# Patient Record
Sex: Male | Born: 1995 | Race: White | Hispanic: No | Marital: Single | Smoking: Never smoker
Health system: Southern US, Community
[De-identification: ages and names within clinical notes are randomized; demographics above are authoritative.]

## PROBLEM LIST (undated history)

## (undated) DIAGNOSIS — F909 Attention-deficit hyperactivity disorder, unspecified type: Secondary | ICD-10-CM

## (undated) DIAGNOSIS — D6941 Evans syndrome: Secondary | ICD-10-CM

## (undated) DIAGNOSIS — D693 Immune thrombocytopenic purpura: Secondary | ICD-10-CM

## (undated) DIAGNOSIS — D509 Iron deficiency anemia, unspecified: Secondary | ICD-10-CM

## (undated) HISTORY — PX: TONSILLECTOMY: SUR1361

## (undated) HISTORY — DX: Iron deficiency anemia, unspecified: D50.9

## (undated) HISTORY — PX: ADENOIDECTOMY: SUR15

## (undated) HISTORY — PX: TYMPANOSTOMY TUBE PLACEMENT: SHX32

## (undated) HISTORY — DX: Immune thrombocytopenic purpura: D69.3

---
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
## 2017-02-11 ENCOUNTER — Encounter (HOSPITAL_COMMUNITY): Payer: Self-pay

## 2017-04-18 ENCOUNTER — Ambulatory Visit (HOSPITAL_COMMUNITY): Payer: Managed Care, Other (non HMO)

## 2017-04-18 ENCOUNTER — Other Ambulatory Visit (HOSPITAL_COMMUNITY): Payer: Managed Care, Other (non HMO)

## 2017-04-21 ENCOUNTER — Ambulatory Visit (HOSPITAL_COMMUNITY): Payer: Managed Care, Other (non HMO)

## 2017-04-21 ENCOUNTER — Other Ambulatory Visit (HOSPITAL_COMMUNITY): Payer: Managed Care, Other (non HMO)

## 2017-05-05 ENCOUNTER — Other Ambulatory Visit: Payer: Self-pay

## 2017-05-05 ENCOUNTER — Encounter (HOSPITAL_COMMUNITY): Payer: Self-pay | Admitting: Oncology

## 2017-05-05 ENCOUNTER — Inpatient Hospital Stay (HOSPITAL_BASED_OUTPATIENT_CLINIC_OR_DEPARTMENT_OTHER): Payer: Managed Care, Other (non HMO) | Admitting: Oncology

## 2017-05-05 ENCOUNTER — Inpatient Hospital Stay (HOSPITAL_COMMUNITY): Payer: Managed Care, Other (non HMO) | Attending: Oncology

## 2017-05-05 VITALS — BP 136/78 | HR 85 | Temp 98.1°F | Resp 20 | Wt 238.8 lb

## 2017-05-05 DIAGNOSIS — D696 Thrombocytopenia, unspecified: Secondary | ICD-10-CM

## 2017-05-05 DIAGNOSIS — D6941 Evans syndrome: Secondary | ICD-10-CM | POA: Insufficient documentation

## 2017-05-05 DIAGNOSIS — D591 Other autoimmune hemolytic anemias: Secondary | ICD-10-CM | POA: Insufficient documentation

## 2017-05-05 DIAGNOSIS — D509 Iron deficiency anemia, unspecified: Secondary | ICD-10-CM

## 2017-05-05 LAB — CBC WITH DIFFERENTIAL/PLATELET
Basophils Absolute: 0 10*3/uL (ref 0.0–0.1)
Basophils Relative: 0 %
Eosinophils Absolute: 0.1 10*3/uL (ref 0.0–0.7)
Eosinophils Relative: 1 %
HEMATOCRIT: 47.9 % (ref 39.0–52.0)
HEMOGLOBIN: 16.3 g/dL (ref 13.0–17.0)
LYMPHS ABS: 2.2 10*3/uL (ref 0.7–4.0)
Lymphocytes Relative: 31 %
MCH: 28.4 pg (ref 26.0–34.0)
MCHC: 34 g/dL (ref 30.0–36.0)
MCV: 83.4 fL (ref 78.0–100.0)
MONOS PCT: 11 %
Monocytes Absolute: 0.8 10*3/uL (ref 0.1–1.0)
NEUTROS ABS: 4.1 10*3/uL (ref 1.7–7.7)
NEUTROS PCT: 57 %
Platelets: 337 10*3/uL (ref 150–400)
RBC: 5.74 MIL/uL (ref 4.22–5.81)
RDW: 13.7 % (ref 11.5–15.5)
WBC: 7.2 10*3/uL (ref 4.0–10.5)

## 2017-05-05 LAB — RETICULOCYTES
RBC.: 5.67 MIL/uL (ref 4.22–5.81)
Retic Count, Absolute: 51 10*3/uL (ref 19.0–186.0)
Retic Ct Pct: 0.9 % (ref 0.4–3.1)

## 2017-05-05 LAB — COMPREHENSIVE METABOLIC PANEL
ALK PHOS: 90 U/L (ref 38–126)
ALT: 32 U/L (ref 17–63)
ANION GAP: 9 (ref 5–15)
AST: 25 U/L (ref 15–41)
Albumin: 4.5 g/dL (ref 3.5–5.0)
BILIRUBIN TOTAL: 0.9 mg/dL (ref 0.3–1.2)
BUN: 10 mg/dL (ref 6–20)
CO2: 28 mmol/L (ref 22–32)
Calcium: 9.6 mg/dL (ref 8.9–10.3)
Chloride: 102 mmol/L (ref 101–111)
Creatinine, Ser: 0.92 mg/dL (ref 0.61–1.24)
GLUCOSE: 134 mg/dL — AB (ref 65–99)
POTASSIUM: 4.1 mmol/L (ref 3.5–5.1)
Sodium: 139 mmol/L (ref 135–145)
TOTAL PROTEIN: 8.4 g/dL — AB (ref 6.5–8.1)

## 2017-05-05 LAB — IRON AND TIBC
IRON: 121 ug/dL (ref 45–182)
SATURATION RATIOS: 31 % (ref 17.9–39.5)
TIBC: 386 ug/dL (ref 250–450)
UIBC: 265 ug/dL

## 2017-05-05 LAB — LACTATE DEHYDROGENASE: LDH: 137 U/L (ref 98–192)

## 2017-05-05 LAB — FERRITIN: FERRITIN: 97 ng/mL (ref 24–336)

## 2017-05-05 NOTE — Patient Instructions (Signed)
[facility] at [facility] Discharge Instructions  RECOMMENDATIONS MADE BY THE CONSULTANT AND ANY TEST RESULTS WILL BE SENT TO YOUR REFERRING PHYSICIAN.    Thank you for choosing [facility] at [facility] to provide your oncology and hematology care.  To afford each patient quality time with our provider, please arrive at least 15 minutes before your scheduled appointment time.    If you have a lab appointment with the Cancer Center please come in thru the  Main Entrance and check in at the main information desk  You need to re-schedule your appointment should you arrive 10 or more minutes late.  We strive to give you quality time with our providers, and arriving late affects you and other patients whose appointments are after yours.  Also, if you no show three or more times for appointments you may be dismissed from the clinic at the providers discretion.     Again, thank you for choosing [facility].  Our hope is that these requests will decrease the amount of time that you wait before being seen by our physicians.       _____________________________________________________________  Should you have questions after your visit to [facility], please contact our office at [phone] between the hours of 8:30 a.m. and 4:30 p.m.  Voicemails left after 4:30 p.m. will not be returned until the following business day.  For prescription refill requests, have your pharmacy contact our office.       Resources For Cancer Patients and their Caregivers ? American Cancer Society: Can assist with transportation, wigs, general needs, runs Look Good Feel Better.        1-888-227-6333 ? Cancer Care: Provides financial assistance, online support groups, medication/co-pay assistance.  1-800-813-HOPE (4673) ? [facility] Assists [place] cancer patients and their families through emotional , educational  and financial support.  336-427-4357 ? [place] DSS Where to apply for food stamps, Medicaid and utility assistance. 336-342-1394 ? RC[facility]: Transportation to medical appointments. 336-347-2287 ? Social Security Administration: May apply for disability if have a Stage IV cancer. 336-342-7796 1-800-772-1213 ? [place] Aging, Disability and Transit Services: Assists with nutrition, care and transit needs. 336-349-2343  Cancer Center Support Programs: @10RELATIVEDAYS@ > Cancer Support Group  2nd Tuesday of the month 1pm-2pm, Journey Room  > Creative Journey  3rd Tuesday of the month 1130am-1pm, Journey Room  > Look Good Feel Better  1st Wednesday of the month 10am-12 noon, Journey Room (Call American Cancer Society to register 1-800-395-5775)   

## 2017-05-05 NOTE — Progress Notes (Signed)
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  Initially diagnosed on 02/15/2016 when he [facility] with epistaxis and mucosal hematomas with a platelet count of < 10,000.  He responded to IVIG with platelet count reaching > 100,000 within 72 hours of treatment, BUT 11 days after treatment, he again relapsed with a platelet count < 10,000 without evidence of bleeding.  He was re-admitted and given IVIG again with ANOTHER TRANSIENT response.  After an initial drop in HGB from 13 to 8 g/dL during his initial hospitalization, his HGB has been between 9-10 g/dL without transfusion.  DAT positive for IgG (PRIOR to IVIG).  Markers for hemolysis have been consistently negative. Upon transfer of [facility], he began therapy with Rituxan weekly x 4 doses beginning on 03/20/16 and completed on 04/11/16.  AND Iron deficiency anemia.  He received last IV iron infusion on 04/03/16.   INTERVAL HISTORY:  Mr. Reginold AgentGusler 22 y.o. male returns for follow-up for Evans syndrome.   Patient presents today with his mother.  Overall, he tells me he is doing "very well".  Appetite and energy levels are 100%.  Completed Rituxan therapy approximately 1 year ago  (03/2016).  Continues to have nosebleeds on occasion.  They are self-limiting and last approximately 5 minutes without intervention.  He denies any additional bleeding episodes or bruising.  His mother reports that she thinks he is overall doing well.  He is not taking any additional medications at this time.  REVIEW OF SYSTEMS:  Review of Systems  Constitutional: Negative.  Negative for chills, fever and malaise/fatigue.  HENT: Positive for nosebleeds. Negative for sore throat.   Eyes: Negative.   Respiratory: Negative.  Negative for cough and shortness of breath.   Cardiovascular: Negative.  Negative for chest pain, palpitations and leg swelling.  Gastrointestinal: Negative for abdominal pain, blood in stool, constipation, diarrhea, melena, nausea and vomiting.  Genitourinary: Negative.  Negative for dysuria and hematuria.  Musculoskeletal: Negative.  Negative for back pain, joint pain and myalgias.  Skin: Negative.  Negative for rash.  Neurological: Negative.  Negative for dizziness and headaches.  Endo/Heme/Allergies: Negative.  Does not bruise/bleed easily.  Psychiatric/Behavioral: Negative.  Negative for depression. The patient is not nervous/anxious and does not have insomnia.   14 point review of systems was performed and is negative except as detailed under history of present illness and above   PAST MEDICAL HISTORY:   Past Medical History:  Diagnosis Date  . 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 ADHD (attention deficit hyperactivity disorder)   . 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HCC)   . Iron deficiency anemia 03/18/2016    ALLERGIES: No Known Allergies    MEDICATIONS: I have reviewed the patient's current medications.    Current Outpatient Medications on File Prior to Visit  Medication Sig  Dispense Refill  . acetaminophen (TYLENOL) 325 MG tablet Take 2 tablets (650 mg total) by mouth every 6 (six) hours as needed for headache (mild pain, fever >100.4). (Patient not taking: Reported on 12/17/2016)  30 tablet 0   No current facility-administered medications on file prior to visit.      PAST SURGICAL HISTORY Past Surgical History:  Procedure Laterality Date  . ADENOIDECTOMY    . TONSILLECTOMY    . TYMPANOSTOMY TUBE PLACEMENT      FAMILY HISTORY: Family History  Problem Relation Age of Onset  . Cancer Maternal Grandmother   . Diabetes Maternal Grandfather   . Hypertension Maternal Grandfather   . Diabetes Paternal Grandmother    Mother is alive at 45 yo with TypeII DM Father alive at the age of 80 in good health Brother is 32 yo and healthy Maternal grandfather with DM Maternal grandmother with H/O breast cancer Cherie Dark). Paternal grandmother with DM Paternal grandfather who is healthy.  SOCIAL HISTORY: Denies tobacco, EtOH and illicit drug abuse.  He is Control and instrumentation engineer in religion.  He is a Holiday representative in [employer].  He is a Engineer, water.  Social History   Socioeconomic History  . Marital status: Single    Spouse name: None  . Number of children: None  . Years of education: None  . Highest education level: None  Social Needs  . Financial resource strain: None  . Food insecurity - worry: None  . Food insecurity - inability: None  . Transportation needs - medical: None  . Transportation needs - non-medical: None  Occupational History  . None  Tobacco Use  . Smoking status: Never Smoker  . Smokeless tobacco: Never Used  Substance and Sexual Activity  . Alcohol use: No  . Drug use: No  . Sexual activity: No  Other Topics Concern  . None  Social History Narrative   Lives with Mother and brother (76 yrs old); No pets in the house; No smokers in the house.    PERFORMANCE STATUS: 0 - Asymptomatic    PHYSICAL EXAM:  Vitals:   05/05/17 0929  BP: 136/78  Pulse: 85  Resp: 20  Temp: 98.1 F (36.7 C)  SpO2: 99%   Filed Weights   05/05/17 0929  Weight: 238 lb 12.8 oz (108.3 kg)    Physical Exam  Constitutional: He is oriented to person, place,  and time and well-developed, well-nourished, and in no distress.  HENT:  Head: Normocephalic.  Mouth/Throat: Oropharynx is clear and moist. No oropharyngeal exudate.  Eyes: Conjunctivae are normal. Pupils are equal, round, and reactive to light. No scleral icterus.  Neck: Normal range of motion. Neck supple.  Cardiovascular: Normal rate, regular rhythm and normal heart sounds.  Pulmonary/Chest: Effort normal and breath sounds normal. No respiratory distress. He has no wheezes. He has no rales.  Abdominal: Soft. Bowel sounds are normal. He exhibits no distension. There is no tenderness.  Musculoskeletal: Normal range of motion. He exhibits no edema.  Lymphadenopathy:    He has no cervical adenopathy.       Right: No supraclavicular adenopathy present.       Left: No supraclavicular adenopathy present.  Neurological: He is alert and oriented to person, place, and time. No cranial nerve deficit. Gait normal.  Skin: Skin is warm and dry. No rash noted.  Psychiatric: Mood, memory, affect and judgment normal.  Nursing note and vitals reviewed.    LABORATORY DATA:  I have reviewed the data as listed. CBC  Component Value Date/Time   WBC 7.2 05/05/2017 0834   RBC 5.74 05/05/2017 0834   HGB 16.3 05/05/2017 0834   HCT 47.9 05/05/2017 0834   PLT 337 05/05/2017 0834   MCV 83.4 05/05/2017 0834   MCH 28.4 05/05/2017 0834   MCHC 34.0 05/05/2017 0834   RDW 13.7 05/05/2017 0834   LYMPHSABS 2.2 05/05/2017 0834   MONOABS 0.8 05/05/2017 0834   EOSABS 0.1 05/05/2017 0834   BASOSABS 0.0 05/05/2017 0834     Chemistry      Component Value Date/Time   NA 139 05/05/2017 0834   K 4.1 05/05/2017 0834   CL 102 05/05/2017 0834   CO2 28 05/05/2017 0834   BUN 10 05/05/2017 0834   CREATININE 0.92 05/05/2017 0834      Component Value Date/Time   CALCIUM 9.6 05/05/2017 0834   ALKPHOS 90 05/05/2017 0834   AST 25 05/05/2017 0834   ALT 32 05/05/2017 0834   BILITOT 0.9 05/05/2017 0834          He RADIOGRAPHY: No results found.    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  Initially diagnosed on 02/15/2016 when he presented to [facility] pediatric service with epistaxis and mucosal hematomas with a platelet count of < 10,000.  He responded to IVIG with platelet count reaching > 100,000 within 72 hours of treatment, BUT 11 days after treatment, he again relapsed with a platelet count < 10,000 without evidence of bleeding.  He was re-admitted and given IVIG again with ANOTHER TRANSIENT response.  After an initial drop in HGB from 13 to 8 g/dL during his initial hospitalization, his HGB has been between 9-10 g/dL without transfusion.  DAT positive for IgG (PRIOR to IVIG).  Markers for hemolysis have been consistently negative. Completed Rituxan therapy; last dose 04/11/16.  -Labs reviewed and copy provided to patient and his mom.  Labs are completely normal.  Hemoglobin and platelets are normal.  He denies any frank bleeding episodes with the occasional nosebleed that resolves after approximately 5 minutes. -Encouraged him to call us with any abnormal bleeding or bruising.  -Return to cancer center in 6 months for follow-up with labs.   Intermittent nosebleeds:  -Continues to have intermittent nosebleeds.  They are self-limiting and resolved without intervention.  Continue nasal spray as prescribed.  Iron deficiency anemia -He has been intolerant to oral iron due to constipation.  He received 1 dose of IV iron in the past with great tolerance.  Will call patient and let him know of lab results once they result.  -Last ferritin was 89 in 12/2016.  I suspect they will be normal normal given his excellent hemoglobin of 16.3 today.  -We will replace with IV iron when clinically indicated.  Dispo:  -Return to cancer  center in 6 months for follow-up.   Greater than 50% was spent in counseling and coordination of care with this patient including but not limited to discussion of the relevant topics above (See A&P) including, but not limited to diagnosis and management of acute and chronic medical conditions.   All questions were answered. The patient knows to call the clinic with any problems, questions or concerns. We can certainly see the patient much sooner if necessary.  Orders placed this encounter:  No orders of the defined types were placed in this encounter.   Durenda Hurt, NP 05/05/2017 9:58  AM

## 2017-05-06 LAB — HAPTOGLOBIN: Haptoglobin: 180 mg/dL (ref 34–200)

## 2017-09-09 ENCOUNTER — Other Ambulatory Visit: Payer: Self-pay | Admitting: Otolaryngology

## 2017-09-09 DIAGNOSIS — M272 Inflammatory conditions of jaws: Secondary | ICD-10-CM

## 2017-09-10 ENCOUNTER — Encounter (HOSPITAL_COMMUNITY): Payer: Self-pay

## 2017-09-10 ENCOUNTER — Other Ambulatory Visit (HOSPITAL_COMMUNITY): Payer: Self-pay

## 2017-09-10 DIAGNOSIS — D6941 Evans syndrome: Secondary | ICD-10-CM

## 2017-09-10 NOTE — Progress Notes (Signed)
Received call from patient that he needs to have a tooth extracted on Monday, June 3. The dentist is requesting clearance from the hematologist prior to the procedure. Reviewed with Dr. Kirtland Bouchard. Patient needs CBC before he will grant approval. Labs scheduled for Thursday at 1:20. Patient and mom aware and verbalized understanding.

## 2017-09-11 ENCOUNTER — Inpatient Hospital Stay (HOSPITAL_COMMUNITY): Payer: Managed Care, Other (non HMO) | Attending: Hematology

## 2017-09-11 DIAGNOSIS — D6941 Evans syndrome: Secondary | ICD-10-CM | POA: Insufficient documentation

## 2017-09-11 LAB — CBC WITH DIFFERENTIAL/PLATELET
BASOS ABS: 0 10*3/uL (ref 0.0–0.1)
BASOS PCT: 0 %
EOS ABS: 0.2 10*3/uL (ref 0.0–0.7)
Eosinophils Relative: 2 %
HEMATOCRIT: 44.5 % (ref 39.0–52.0)
HEMOGLOBIN: 15.5 g/dL (ref 13.0–17.0)
Lymphocytes Relative: 24 %
Lymphs Abs: 2.4 10*3/uL (ref 0.7–4.0)
MCH: 29.1 pg (ref 26.0–34.0)
MCHC: 34.8 g/dL (ref 30.0–36.0)
MCV: 83.5 fL (ref 78.0–100.0)
Monocytes Absolute: 1.1 10*3/uL — ABNORMAL HIGH (ref 0.1–1.0)
Monocytes Relative: 10 %
NEUTROS ABS: 6.6 10*3/uL (ref 1.7–7.7)
NEUTROS PCT: 64 %
Platelets: 399 10*3/uL (ref 150–400)
RBC: 5.33 MIL/uL (ref 4.22–5.81)
RDW: 12.8 % (ref 11.5–15.5)
WBC: 10.2 10*3/uL (ref 4.0–10.5)

## 2017-11-03 ENCOUNTER — Other Ambulatory Visit (HOSPITAL_COMMUNITY): Payer: Self-pay | Admitting: *Deleted

## 2017-11-03 DIAGNOSIS — D6941 Evans syndrome: Secondary | ICD-10-CM

## 2017-11-04 ENCOUNTER — Ambulatory Visit (HOSPITAL_COMMUNITY): Payer: Managed Care, Other (non HMO) | Admitting: Hematology

## 2017-11-04 ENCOUNTER — Inpatient Hospital Stay (HOSPITAL_COMMUNITY): Payer: Managed Care, Other (non HMO) | Attending: Hematology

## 2017-11-04 ENCOUNTER — Other Ambulatory Visit (HOSPITAL_COMMUNITY): Payer: Managed Care, Other (non HMO)

## 2017-11-04 DIAGNOSIS — D509 Iron deficiency anemia, unspecified: Secondary | ICD-10-CM | POA: Insufficient documentation

## 2017-11-04 DIAGNOSIS — D6941 Evans syndrome: Secondary | ICD-10-CM

## 2017-11-04 DIAGNOSIS — E669 Obesity, unspecified: Secondary | ICD-10-CM | POA: Diagnosis not present

## 2017-11-04 DIAGNOSIS — D591 Other autoimmune hemolytic anemias: Secondary | ICD-10-CM | POA: Insufficient documentation

## 2017-11-04 LAB — COMPREHENSIVE METABOLIC PANEL
ALBUMIN: 4.4 g/dL (ref 3.5–5.0)
ALT: 29 U/L (ref 0–44)
ANION GAP: 7 (ref 5–15)
AST: 23 U/L (ref 15–41)
Alkaline Phosphatase: 91 U/L (ref 38–126)
BUN: 11 mg/dL (ref 6–20)
CHLORIDE: 104 mmol/L (ref 98–111)
CO2: 30 mmol/L (ref 22–32)
Calcium: 9.7 mg/dL (ref 8.9–10.3)
Creatinine, Ser: 0.83 mg/dL (ref 0.61–1.24)
GFR calc Af Amer: >60 mL/min (ref >60)
GFR calc non Af Amer: >60 mL/min (ref >60)
GLUCOSE: 103 mg/dL — AB (ref 70–99)
POTASSIUM: 3.9 mmol/L (ref 3.5–5.1)
Sodium: 141 mmol/L (ref 135–145)
Total Bilirubin: 0.7 mg/dL (ref 0.3–1.2)
Total Protein: 8.3 g/dL — ABNORMAL HIGH (ref 6.5–8.1)

## 2017-11-04 LAB — IRON AND TIBC
Iron: 61 ug/dL (ref 45–182)
SATURATION RATIOS: 14 % — AB (ref 17.9–39.5)
TIBC: 423 ug/dL (ref 250–450)
UIBC: 362 ug/dL

## 2017-11-04 LAB — CBC WITH DIFFERENTIAL/PLATELET
BASOS ABS: 0 10*3/uL (ref 0.0–0.1)
BASOS PCT: 0 %
EOS ABS: 0.1 10*3/uL (ref 0.0–0.7)
Eosinophils Relative: 1 %
HCT: 44.9 % (ref 39.0–52.0)
HEMOGLOBIN: 15.4 g/dL (ref 13.0–17.0)
Lymphocytes Relative: 33 %
Lymphs Abs: 3.3 10*3/uL (ref 0.7–4.0)
MCH: 28.7 pg (ref 26.0–34.0)
MCHC: 34.3 g/dL (ref 30.0–36.0)
MCV: 83.6 fL (ref 78.0–100.0)
MONO ABS: 0.9 10*3/uL (ref 0.1–1.0)
MONOS PCT: 10 %
NEUTROS PCT: 56 %
Neutro Abs: 5.5 10*3/uL (ref 1.7–7.7)
Platelets: 327 10*3/uL (ref 150–400)
RBC: 5.37 MIL/uL (ref 4.22–5.81)
RDW: 13.3 % (ref 11.5–15.5)
WBC: 9.9 10*3/uL (ref 4.0–10.5)

## 2017-11-04 LAB — RETICULOCYTES
RBC.: 5.37 MIL/uL (ref 4.22–5.81)
RETIC CT PCT: 1.3 % (ref 0.4–3.1)
Retic Count, Absolute: 69.8 10*3/uL (ref 19.0–186.0)

## 2017-11-04 LAB — FERRITIN: FERRITIN: 92 ng/mL (ref 24–336)

## 2017-11-04 LAB — LACTATE DEHYDROGENASE: LDH: 134 U/L (ref 98–192)

## 2017-11-05 LAB — HAPTOGLOBIN: Haptoglobin: 204 mg/dL — ABNORMAL HIGH (ref 34–200)

## 2017-11-12 ENCOUNTER — Other Ambulatory Visit: Payer: Self-pay

## 2017-11-12 ENCOUNTER — Inpatient Hospital Stay (HOSPITAL_BASED_OUTPATIENT_CLINIC_OR_DEPARTMENT_OTHER): Payer: Managed Care, Other (non HMO) | Admitting: Internal Medicine

## 2017-11-12 ENCOUNTER — Encounter (HOSPITAL_COMMUNITY): Payer: Self-pay | Admitting: Internal Medicine

## 2017-11-12 VITALS — BP 147/84 | HR 83 | Temp 98.4°F | Resp 20 | Wt 236.0 lb

## 2017-11-12 DIAGNOSIS — D591 Other autoimmune hemolytic anemias: Secondary | ICD-10-CM

## 2017-11-12 DIAGNOSIS — D509 Iron deficiency anemia, unspecified: Secondary | ICD-10-CM

## 2017-11-12 DIAGNOSIS — D6941 Evans syndrome: Secondary | ICD-10-CM | POA: Diagnosis not present

## 2017-11-12 DIAGNOSIS — D508 Other iron deficiency anemias: Secondary | ICD-10-CM

## 2017-11-12 NOTE — Patient Instructions (Signed)
[facility] Discharge Instructions  Today you saw Dr. Higgs   Thank you for choosing [facility] at [facility] to provide your oncology and hematology care.  To afford each patient quality time with our provider, please arrive at least 15 minutes before your scheduled appointment time.   If you have a lab appointment with the Cancer Center please come in thru the  Main Entrance and check in at the main information desk  You need to re-schedule your appointment should you arrive 10 or more minutes late.  We strive to give you quality time with our providers, and arriving late affects you and other patients whose appointments are after yours.  Also, if you no show three or more times for appointments you may be dismissed from the clinic at the providers discretion.     Again, thank you for choosing [facility].  Our hope is that these requests will decrease the amount of time that you wait before being seen by our physicians.       _____________________________________________________________  Should you have questions after your visit to [facility], please contact our office at [phone] between the hours of 8:00 a.m. and 4:30 p.m.  Voicemails left after 4:00 p.m. will not be returned until the following business day.  For prescription refill requests, have your pharmacy contact our office and allow 72 hours.    Cancer Center Support Programs:   > Cancer Support Group  2nd Tuesday of the month 1pm-2pm, Journey Room   

## 2017-11-12 NOTE — Progress Notes (Signed)
Diagnosis Jeremy Ford ([place]) - Plan: CBC with Differential/Platelet, Lactate dehydrogenase, Comprehensive metabolic panel, Ferritin, Haptoglobin  Iron deficiency anemia, unspecified iron deficiency anemia type  Other iron deficiency anemia - Plan: CBC with Differential/Platelet, Lactate dehydrogenase, Comprehensive metabolic panel, Ferritin, Haptoglobin  Staging Cancer Staging No matching staging information was found for the patient.  Assessment and Plan: 1.  Jeremy Ford. 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  Initially diagnosed on 02/15/2016 when he presented to [facility] pediatric service with epistaxis and mucosal hematomas with a platelet count of < 10,000.  He responded to IVIG with platelet count reaching > 100,000 within 72 hours of treatment, BUT 11 days after treatment, he again relapsed with a platelet count < 10,000 without evidence of bleeding.  He was re-admitted and given IVIG again with ANOTHER TRANSIENT response.  After an initial drop in HGB from 13 to 8 g/dL during his initial hospitalization, his HGB has been between 9-10 g/dL without transfusion.  DAT positive for IgG (PRIOR to IVIG).  Markers for hemolysis have been consistently negative. Completed Rituxan therapy; last dose 04/11/16.   Labs done 11/04/2017 reviewed with pt and showed WBC 9.9 HB 15.4 plts 327,000.  Chemistries WNL.  Cr 0.83, LDH 134, haptoglobin 204.  Labs have been stable since 2017.  He will RTC in 1 year for follow-up and repeat labs.  He is advised to establish with PCP for yearly physical and labs. He should notify the office if he noted any bleeding or bruising.  He reports he no longer has nose bleeds.    2.  Iron deficiency anemia.  Pt was last treated with IV iron in 08/2016.  Labs done 11/04/2017 show HB 15.4 with ferritin of 92.  He will have repeat labs in 1 year.    3.  Skin lesions.  These are noted on abdomen and arms.  Pt  advised to use acne wash and follow-up with PCP or dermatology.    All questions were answered and pt expressed understanding of the information presented.     INTERVAL HISTORY:  Historical data obtained from the note dated 05/05/2017.  Pt is a 22 y.o. 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  Initially diagnosed on 02/15/2016 when he presented to [facility] pediatric service with epistaxis and mucosal hematomas with a platelet count of < 10,000.  He responded to IVIG with platelet count reaching > 100,000 within 72 hours of treatment, BUT 11 days after treatment, he again relapsed with a platelet count < 10,000 without evidence of bleeding.  He was re-admitted and given IVIG again with ANOTHER TRANSIENT response.  After an initial drop in HGB from 13 to 8 g/dL during his initial hospitalization, his HGB has been between 9-10 g/dL without transfusion.  DAT positive for IgG (PRIOR to IVIG).  Markers for hemolysis have been consistently negative. Upon transfer of his care to [facility], he began therapy with Rituxan weekly x 4 doses beginning on 03/20/16 and completed on 04/11/16.  AND Iron deficiency anemia.  He was treated with IV iron in 08/2016.    Current Status:  Pt is seen today for follow-up.  He is here to go over labs.  He denies any nose bleeds, bruising.  Family member present during visit.   Problem  List Patient Active Problem List   Diagnosis Date Noted  . Iron deficiency anemia [D50.9] 03/18/2016  . Jeremy Ford ([place]) [D69.41]   . Open toe wound, initial encounter [S91.109A] 02/16/2016  . Acute ITP ([place]) [D69.3] 02/15/2016  . Thrombocytopenia ([place]) [D69.6] 02/15/2016    Past Medical History Past Medical History:  Diagnosis Date  . Acute ITP ([place]) 02/15/2016   . ADHD (attention deficit hyperactivity disorder)   . Jeremy Ford ([place])   . Iron deficiency anemia 03/18/2016    Past Surgical History Past Surgical History:  Procedure Laterality Date  . ADENOIDECTOMY    . TONSILLECTOMY    . TYMPANOSTOMY TUBE PLACEMENT      Family History Family History  Problem Relation Age of Onset  . Cancer Maternal Grandmother   . Diabetes Maternal Grandfather   . Hypertension Maternal Grandfather   . Diabetes Paternal Grandmother      Social History  reports that he has never smoked. He has never used smokeless tobacco. He reports that he does not drink alcohol or use drugs.  Medications  Current Outpatient Medications:  .  acetaminophen (TYLENOL) 325 MG tablet, Take 2 tablets (650 mg total) by mouth every 6 (six) hours as needed for headache (mild pain, fever >100.4)., Disp: 30 tablet, Rfl: 0  Allergies Patient has no known allergies.  Review of Systems Review of Systems - Oncology ROS negative   Physical Exam  Vitals Wt Readings from Last 3 Encounters:  11/12/17 236 lb (107 kg) (99 %, Z= 2.17)*  05/05/17 238 lb 12.8 oz (108.3 kg) (99 %, Z= 2.24)*  12/17/16 237 lb 12.8 oz (107.9 kg) (99 %, Z= 2.24)*   * Growth percentiles are based on CDC (Boys, 2-20 Years) data.   Temp Readings from Last 3 Encounters:  11/12/17 98.4 F (36.9 C) (Oral)  05/05/17 98.1 F (36.7 C) (Oral)  08/20/16 98 F (36.7 C) (Oral)   BP Readings from Last 3 Encounters:  11/12/17 (!) 147/84  05/05/17 136/78  12/17/16 140/76   Pulse Readings from Last 3 Encounters:  11/12/17 83  05/05/17 85  12/17/16 99    Constitutional: Well-developed, well-nourished, and in no distress.   HENT: Head: Normocephalic and atraumatic.  Mouth/Throat: No oropharyngeal exudate. Mucosa moist. Eyes: Pupils are equal, round, and reactive to light. Conjunctivae are normal. No scleral icterus.  Neck: Normal range of motion. Neck supple. No JVD present.  Cardiovascular: Normal  rate, regular rhythm and normal heart sounds.  Exam reveals no gallop and no friction rub.   No murmur heard. Pulmonary/Chest: Effort normal and breath sounds normal. No respiratory distress. No wheezes.No rales.  Abdominal: Soft. Bowel sounds are normal. No distension. There is no tenderness. There is no guarding.  Musculoskeletal: No edema or tenderness.  Lymphadenopathy: No cervical, axillary or supraclavicular adenopathy.  Neurological: Alert and oriented to person, place, and time. No cranial nerve deficit.  Skin: Skin is warm.  Skin lesion noted more consistent with scratches from picking at areas.   Psychiatric: Affect and judgment normal.   Labs No visits with results within 3 Day(s) from this visit.  Latest known visit with results is:  Appointment on 11/04/2017  Component Date Value Ref Range Status  . WBC 11/04/2017 9.9  4.0 - 10.5 K/uL Final  . RBC 11/04/2017 5.37  4.22 - 5.81 MIL/uL Final  . Hemoglobin 11/04/2017 15.4  13.0 - 17.0 g/dL Final  . HCT 11/04/2017 44.9  39.0 - 52.0 % Final  . MCV 11/04/2017 83.6  78.0 - 100.0 fL Final  . MCH 11/04/2017 28.7  26.0 - 34.0 pg Final  . MCHC 11/04/2017 34.3  30.0 - 36.0 g/dL Final  . RDW 11/04/2017 13.3  11.5 - 15.5 % Final  . Platelets 11/04/2017 327  150 - 400 K/uL Final  . Neutrophils Relative % 11/04/2017 56  % Final  . Neutro Abs 11/04/2017 5.5  1.7 - 7.7 K/uL Final  . Lymphocytes Relative 11/04/2017 33  % Final  . Lymphs Abs 11/04/2017 3.3  0.7 - 4.0 K/uL Final  . Monocytes Relative 11/04/2017 10  % Final  . Monocytes Absolute 11/04/2017 0.9  0.1 - 1.0 K/uL Final  . Eosinophils Relative 11/04/2017 1  % Final  . Eosinophils Absolute 11/04/2017 0.1  0.0 - 0.7 K/uL Final  . Basophils Relative 11/04/2017 0  % Final  . Basophils Absolute 11/04/2017 0.0  0.0 - 0.1 K/uL Final   Performed at [facility], [address], [place], [place] [address]  . Sodium 11/04/2017 141  135 - 145 mmol/L Final  . Potassium 11/04/2017 3.9  3.5 -  5.1 mmol/L Final  . Chloride 11/04/2017 104  98 - 111 mmol/L Final  . CO2 11/04/2017 30  22 - 32 mmol/L Final  . Glucose, Bld 11/04/2017 103* 70 - 99 mg/dL Final  . BUN 11/04/2017 11  6 - 20 mg/dL Final  . Creatinine, Ser 11/04/2017 0.83  0.61 - 1.24 mg/dL Final  . Calcium 11/04/2017 9.7  8.9 - 10.3 mg/dL Final  . Total Protein 11/04/2017 8.3* 6.5 - 8.1 g/dL Final  . Albumin 11/04/2017 4.4  3.5 - 5.0 g/dL Final  . AST 11/04/2017 23  15 - 41 U/L Final  . ALT 11/04/2017 29  0 - 44 U/L Final  . Alkaline Phosphatase 11/04/2017 91  38 - 126 U/L Final  . Total Bilirubin 11/04/2017 0.7  0.3 - 1.2 mg/dL Final  . GFR calc non Af Amer 11/04/2017 >60  >60 mL/min Final  . GFR calc Af Amer 11/04/2017 >60  >60 mL/min Final   Comment: (NOTE) The eGFR has been calculated using the CKD EPI equation. This calculation has not been validated in all clinical situations. eGFR's persistently <60 mL/min signify possible Chronic Kidney Disease.   Georgiann Hahn gap 11/04/2017 7  5 - 15 Final   Performed at [facility], [address], [place], [place] [address]  . LDH 11/04/2017 134  98 - 192 U/L Final   Performed at [facility], [address] [place], [place] [address]  . Iron 11/04/2017 61  45 - 182 ug/dL Final  . TIBC 11/04/2017 423  250 - 450 ug/dL Final  . Saturation Ratios 11/04/2017 14* 17.9 - 39.5 % Final  . UIBC 11/04/2017 362  ug/dL Final   Performed at [place] [address] [place], [place] [address]  . Ferritin 11/04/2017 92  24 - 336 ng/mL Final   Performed at [facility] [address], [place], [place] [address]  . Retic Ct Pct 11/04/2017 1.3  0.4 - 3.1 % Final  . RBC. 11/04/2017 5.37  4.22 - 5.81 MIL/uL Final  . Retic Count, Absolute 11/04/2017 69.8  19.0 - 186.0 K/uL Final   Performed at [facility], [address] [place], [place] [address]  . Haptoglobin 11/04/2017 204* 34 - 200 mg/dL Final   Comment: (NOTE) Performed At: [facility] [place], [place] [id number] Rush Farmer MD UG:[id number]  Pathology Orders Placed This Encounter  Procedures  . CBC with Differential/Platelet    Standing Status:   Future    Standing Expiration Date:   11/13/2019  . Lactate dehydrogenase    Standing Status:   Future    Standing Expiration Date:   11/13/2019  . Comprehensive metabolic panel    Standing Status:   Future    Standing Expiration Date:   11/13/2019  . Ferritin    Standing Status:   Future    Standing Expiration Date:   11/13/2019  . Haptoglobin    Standing Status:   Future    Standing Expiration Date:   11/13/2019       Zoila Shutter MD

## 2018-11-06 ENCOUNTER — Other Ambulatory Visit (HOSPITAL_COMMUNITY): Payer: Self-pay | Admitting: *Deleted

## 2018-11-06 DIAGNOSIS — D509 Iron deficiency anemia, unspecified: Secondary | ICD-10-CM

## 2018-11-06 DIAGNOSIS — D508 Other iron deficiency anemias: Secondary | ICD-10-CM

## 2018-11-06 DIAGNOSIS — D6941 Evans syndrome: Secondary | ICD-10-CM

## 2018-11-09 ENCOUNTER — Other Ambulatory Visit (HOSPITAL_COMMUNITY): Payer: Managed Care, Other (non HMO)

## 2018-11-16 ENCOUNTER — Inpatient Hospital Stay (HOSPITAL_COMMUNITY): Payer: 59 | Admitting: Hematology

## 2018-11-16 ENCOUNTER — Other Ambulatory Visit: Payer: Self-pay

## 2019-02-03 NOTE — Progress Notes (Signed)
This encounter was created in error - please disregard.

## 2019-08-11 ENCOUNTER — Ambulatory Visit: Payer: 59 | Admitting: Family Medicine

## 2019-08-11 ENCOUNTER — Encounter: Payer: Self-pay | Admitting: Family Medicine

## 2019-08-11 ENCOUNTER — Other Ambulatory Visit: Payer: Self-pay

## 2019-08-11 VITALS — BP 143/85 | HR 83 | Temp 99.0°F | Ht 68.0 in | Wt 211.6 lb

## 2019-08-11 DIAGNOSIS — D509 Iron deficiency anemia, unspecified: Secondary | ICD-10-CM

## 2019-08-11 DIAGNOSIS — D6941 Evans syndrome: Secondary | ICD-10-CM | POA: Diagnosis not present

## 2019-08-11 DIAGNOSIS — Z0000 Encounter for general adult medical examination without abnormal findings: Secondary | ICD-10-CM

## 2019-08-11 DIAGNOSIS — Z0001 Encounter for general adult medical examination with abnormal findings: Secondary | ICD-10-CM

## 2019-08-11 DIAGNOSIS — R030 Elevated blood-pressure reading, without diagnosis of hypertension: Secondary | ICD-10-CM

## 2019-08-11 NOTE — Progress Notes (Signed)
New Patient Office Visit  Assessment & Plan:  1. Well adult exam - UTD with vaccines except COVID-19 which he declines.   2. Jeremy Ford ([place]) - Managed by hematologist. Lab work completed here so patient does not have to go just for labs.  - CBC with Differential/Platelet - Iron, TIBC and Ferritin Panel - Lactate Dehydrogenase - Reticulocytes - Haptoglobin - CMP14+EGFR  3. Iron deficiency anemia, unspecified iron deficiency anemia type - CBC with Differential/Platelet - Iron, TIBC and Ferritin Panel - Reticulocytes  4. Elevated BP without diagnosis of hypertension - Patient to keep a log of BP and bring it with him when he returns. Encouraged diet and exercise. Education provided on the DASH diet.    Follow-up: Return in about 3 months (around 11/10/2019) for BP.   [name], MSN, APRN, FNP-C [facility]  Subjective:  Patient ID: Jeremy Ford, male    DOB: 1996/04/07  Age: 24 y.o. MRN: [id number]  Patient Care Team: Jeremy Brooklyn, FNP as PCP - General (Family Medicine)  CC:  Chief Complaint  Patient presents with  . New Patient (Initial Visit)    [place]   . Establish Care    HPI Jeremy Ford presents to establish care. He is transferring care from Jeremy Ford office as he has retired and the office has closed.   Patient is requesting lab work for his hematologist due to Jeremy Ford.    Review of Systems  Constitutional: Negative for chills, fever, malaise/fatigue and weight loss.  HENT: Negative for congestion, ear discharge, ear pain, nosebleeds, sinus pain, sore throat and tinnitus.   Eyes: Negative for blurred vision, double vision, pain, discharge and redness.  Respiratory: Negative for cough, shortness of breath and wheezing.   Cardiovascular: Negative for chest pain, palpitations and leg swelling.  Gastrointestinal: Negative for abdominal pain, blood in stool, constipation, diarrhea, heartburn, nausea and  vomiting.  Genitourinary: Negative for dysuria, frequency and urgency.  Musculoskeletal: Negative for myalgias.  Skin: Negative for rash.  Neurological: Negative for dizziness, seizures, weakness and headaches.  Endo/Heme/Allergies: Does not bruise/bleed easily.  Psychiatric/Behavioral: Negative for depression, substance abuse and suicidal ideas. The patient is not nervous/anxious.    No current outpatient medications on file.  Allergies  Allergen Reactions  . Ibuprofen Other (See Comments)    States it messes with his platelets  . Iron     Other reaction(s): Constipation    Past Medical History:  Diagnosis Date  . 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 ADHD (attention deficit hyperactivity disorder)   . Jeremy Ford ([place])   . Iron deficiency anemia 03/18/2016    Past Surgical History:  Procedure Laterality Date  . ADENOIDECTOMY    . TONSILLECTOMY    . TYMPANOSTOMY TUBE PLACEMENT      Family History  Problem Relation Age of Onset  . Breast cancer Maternal Grandmother   . Diabetes Maternal Grandfather   . Hypertension Maternal Grandfather   . Diabetes Paternal Grandmother   . Asthma Paternal Grandmother   . Hypertension Paternal Grandmother   . Asthma Mother   . Diabetes Mother   . Hypertension Mother   . Skin cancer Father   . Colon polyps Father   . Colon cancer Maternal Aunt   . Lung cancer Paternal Uncle   . Diabetes Paternal Grandfather   . Hypertension Paternal Grandfather   . Hyperlipidemia Paternal Grandfather   . Skin cancer Paternal Grandfather   . Bladder Cancer Other   .  Throat cancer Other     Social History   Socioeconomic History  . Marital status: Single    Spouse name: Not on file  . Number of children: Not on file  . Years of education: Not on file  . Highest education level: Not on file  Occupational History  . Not on file  Tobacco Use  . Smoking status: Never Smoker  . Smokeless tobacco: Never Used  Substance and Sexual Activity  .  Alcohol use: No  . Drug use: No  . Sexual activity: Never  Other Topics Concern  . Not on file  Social History Narrative   Lives with Mother and brother (18 yrs old); No pets in the house; No smokers in the house.   Social Determinants of Health   Financial Resource Strain:   . Difficulty of Paying Living Expenses:   Food Insecurity:   . Worried About Charity fundraiser in the Last Year:   . Arboriculturist in the Last Year:   Transportation Needs:   . Film/video editor (Medical):   Marland Kitchen Lack of Transportation (Non-Medical):   Physical Activity:   . Days of Exercise per Week:   . Minutes of Exercise per Session:   Stress:   . Feeling of Stress :   Social Connections:   . Frequency of Communication with Friends and Family:   . Frequency of Social Gatherings with Friends and Family:   . Attends Religious Services:   . Active Member of Clubs or Organizations:   . Attends Archivist Meetings:   Marland Kitchen Marital Status:   Intimate Partner Violence:   . Fear of Current or Ex-Partner:   . Emotionally Abused:   Marland Kitchen Physically Abused:   . Sexually Abused:     Objective:   Today's Vitals: BP (!) 143/85   Pulse 83   Temp 99 F (37.2 C) (Temporal)   Ht 5' 8"  (1.727 m)   Wt 211 lb 9.6 oz (96 kg)   SpO2 96%   BMI 32.17 kg/m   Physical Exam Vitals reviewed.  Constitutional:      General: He is not in acute distress.    Appearance: Normal appearance. He is obese. He is not ill-appearing, toxic-appearing or diaphoretic.  HENT:     Head: Normocephalic and atraumatic.     Right Ear: Tympanic membrane, ear canal and external ear normal. There is no impacted cerumen.     Left Ear: Tympanic membrane, ear canal and external ear normal. There is no impacted cerumen.     Nose: Nose normal. No congestion or rhinorrhea.     Mouth/Throat:     Mouth: Mucous membranes are moist.     Pharynx: Oropharynx is clear. No oropharyngeal exudate or posterior oropharyngeal erythema.  Eyes:       General: No scleral icterus.       Right eye: No discharge.        Left eye: No discharge.     Conjunctiva/sclera: Conjunctivae normal.     Pupils: Pupils are equal, round, and reactive to light.  Cardiovascular:     Rate and Rhythm: Normal rate and regular rhythm.     Heart sounds: Normal heart sounds. No murmur. No friction rub. No gallop.   Pulmonary:     Effort: Pulmonary effort is normal. No respiratory distress.     Breath sounds: Normal breath sounds. No stridor. No wheezing, rhonchi or rales.  Abdominal:     General: Abdomen is flat.  Bowel sounds are normal. There is no distension.     Palpations: Abdomen is soft. There is no mass.     Tenderness: There is no abdominal tenderness. There is no guarding or rebound.     Hernia: No hernia is present.  Musculoskeletal:        General: Normal range of motion.     Cervical back: Normal range of motion and neck supple. No rigidity. No muscular tenderness.     Right lower leg: No edema.     Left lower leg: No edema.  Lymphadenopathy:     Cervical: No cervical adenopathy.  Skin:    General: Skin is warm and dry.     Capillary Refill: Capillary refill takes less than 2 seconds.  Neurological:     General: No focal deficit present.     Mental Status: He is alert and oriented to person, place, and time. Mental status is at baseline.  Psychiatric:        Mood and Affect: Mood normal.        Behavior: Behavior normal.        Thought Content: Thought content normal.        Judgment: Judgment normal.

## 2019-08-11 NOTE — Patient Instructions (Signed)
Keep a log of your blood pressure at home every few days.    DASH Eating Plan DASH stands for "Dietary Approaches to Stop Hypertension." The DASH eating plan is a healthy eating plan that has been shown to reduce high blood pressure (hypertension). It may also reduce your risk for type 2 diabetes, heart disease, and stroke. The DASH eating plan may also help with weight loss. What are tips for following this plan?  General guidelines  Avoid eating more than 2,300 mg (milligrams) of salt (sodium) a day. If you have hypertension, you may need to reduce your sodium intake to 1,500 mg a day.  Limit alcohol intake to no more than 1 drink a day for nonpregnant women and 2 drinks a day for men. One drink equals 12 oz of beer, 5 oz of wine, or 1 oz of hard liquor.  Work with your health care provider to maintain a healthy body weight or to lose weight. Ask what an ideal weight is for you.  Get at least 30 minutes of exercise that causes your heart to beat faster (aerobic exercise) most days of the week. Activities may include walking, swimming, or biking.  Work with your health care provider or diet and nutrition specialist (dietitian) to adjust your eating plan to your individual calorie needs. Reading food labels   Check food labels for the amount of sodium per serving. Choose foods with less than 5 percent of the Daily Value of sodium. Generally, foods with less than 300 mg of sodium per serving fit into this eating plan.  To find whole grains, look for the word "whole" as the first word in the ingredient list. Shopping  Buy products labeled as "low-sodium" or "no salt added."  Buy fresh foods. Avoid canned foods and premade or frozen meals. Cooking  Avoid adding salt when cooking. Use salt-free seasonings or herbs instead of table salt or sea salt. Check with your health care provider or pharmacist before using salt substitutes.  Do not fry foods. Cook foods using healthy methods such  as baking, boiling, grilling, and broiling instead.  Cook with heart-healthy oils, such as olive, canola, soybean, or sunflower oil. Meal planning  Eat a balanced diet that includes: ? 5 or more servings of fruits and vegetables each day. At each meal, try to fill half of your plate with fruits and vegetables. ? Up to 6-8 servings of whole grains each day. ? Less than 6 oz of lean meat, poultry, or fish each day. A 3-oz serving of meat is about the same size as a deck of cards. One egg equals 1 oz. ? 2 servings of low-fat dairy each day. ? A serving of nuts, seeds, or beans 5 times each week. ? Heart-healthy fats. Healthy fats called Omega-3 fatty acids are found in foods such as flaxseeds and coldwater fish, like sardines, salmon, and mackerel.  Limit how much you eat of the following: ? Canned or prepackaged foods. ? Food that is high in trans fat, such as fried foods. ? Food that is high in saturated fat, such as fatty meat. ? Sweets, desserts, sugary drinks, and other foods with added sugar. ? Full-fat dairy products.  Do not salt foods before eating.  Try to eat at least 2 vegetarian meals each week.  Eat more home-cooked food and less restaurant, buffet, and fast food.  When eating at a restaurant, ask that your food be prepared with less salt or no salt, if possible. What foods are  recommended? The items listed may not be a complete list. Talk with your dietitian about what dietary choices are best for you. Grains Whole-grain or whole-wheat bread. Whole-grain or whole-wheat pasta. Brown rice. Modena Morrow. Bulgur. Whole-grain and low-sodium cereals. Pita bread. Low-fat, low-sodium crackers. Whole-wheat flour tortillas. Vegetables Fresh or frozen vegetables (raw, steamed, roasted, or grilled). Low-sodium or reduced-sodium tomato and vegetable juice. Low-sodium or reduced-sodium tomato sauce and tomato paste. Low-sodium or reduced-sodium canned vegetables. Fruits All fresh,  dried, or frozen fruit. Canned fruit in natural juice (without added sugar). Meat and other protein foods Skinless chicken or [place]. Ground chicken or [place]. Pork with fat trimmed off. Fish and seafood. Egg whites. Dried beans, peas, or lentils. Unsalted nuts, nut butters, and seeds. Unsalted canned beans. Lean cuts of beef with fat trimmed off. Low-sodium, lean deli meat. Dairy Low-fat (1%) or fat-free (skim) milk. Fat-free, low-fat, or reduced-fat cheeses. Nonfat, low-sodium ricotta or cottage cheese. Low-fat or nonfat yogurt. Low-fat, low-sodium cheese. Fats and oils Soft margarine without trans fats. Vegetable oil. Low-fat, reduced-fat, or light mayonnaise and salad dressings (reduced-sodium). Canola, safflower, olive, soybean, and sunflower oils. Avocado. Seasoning and other foods Herbs. Spices. Seasoning mixes without salt. Unsalted popcorn and pretzels. Fat-free sweets. What foods are not recommended? The items listed may not be a complete list. Talk with your dietitian about what dietary choices are best for you. Grains Baked goods made with fat, such as croissants, muffins, or some breads. Dry pasta or rice meal packs. Vegetables Creamed or fried vegetables. Vegetables in a cheese sauce. Regular canned vegetables (not low-sodium or reduced-sodium). Regular canned tomato sauce and paste (not low-sodium or reduced-sodium). Regular tomato and vegetable juice (not low-sodium or reduced-sodium). Angie Fava. Olives. Fruits Canned fruit in a light or heavy syrup. Fried fruit. Fruit in cream or butter sauce. Meat and other protein foods Fatty cuts of meat. Ribs. Fried meat. Berniece Salines. Sausage. Bologna and other processed lunch meats. Salami. Fatback. Hotdogs. Bratwurst. Salted nuts and seeds. Canned beans with added salt. Canned or smoked fish. Whole eggs or egg yolks. Chicken or [place] with skin. Dairy Whole or 2% milk, cream, and half-and-half. Whole or full-fat cream cheese. Whole-fat or sweetened  yogurt. Full-fat cheese. Nondairy creamers. Whipped toppings. Processed cheese and cheese spreads. Fats and oils Butter. Stick margarine. Lard. Shortening. Ghee. Bacon fat. Tropical oils, such as coconut, palm kernel, or palm oil. Seasoning and other foods Salted popcorn and pretzels. Onion salt, garlic salt, seasoned salt, table salt, and sea salt. Worcestershire sauce. Tartar sauce. Barbecue sauce. Teriyaki sauce. Soy sauce, including reduced-sodium. Steak sauce. Canned and packaged gravies. Fish sauce. Oyster sauce. Cocktail sauce. Horseradish that you find on the shelf. Ketchup. Mustard. Meat flavorings and tenderizers. Bouillon cubes. Hot sauce and Tabasco sauce. Premade or packaged marinades. Premade or packaged taco seasonings. Relishes. Regular salad dressings. Where to find more information:  National Heart, Lung, and [place]: https://wilson-eaton.com/  American Heart Association: www.heart.org Summary  The DASH eating plan is a healthy eating plan that has been shown to reduce high blood pressure (hypertension). It may also reduce your risk for type 2 diabetes, heart disease, and stroke.  With the DASH eating plan, you should limit salt (sodium) intake to 2,300 mg a day. If you have hypertension, you may need to reduce your sodium intake to 1,500 mg a day.  When on the DASH eating plan, aim to eat more fresh fruits and vegetables, whole grains, lean proteins, low-fat dairy, and heart-healthy fats.  Work with your health  care provider or diet and nutrition specialist (dietitian) to adjust your eating plan to your individual calorie needs. This information is not intended to replace advice given to you by your health care provider. Make sure you discuss any questions you have with your health care provider. Document Revised: 03/14/2017 Document Reviewed: 03/25/2016 Elsevier Patient Education  2020 [employer].

## 2019-08-12 LAB — CMP14+EGFR
ALT: 22 IU/L (ref 0–44)
AST: 20 IU/L (ref 0–40)
Albumin/Globulin Ratio: 1.8 (ref 1.2–2.2)
Albumin: 4.8 g/dL (ref 4.1–5.2)
Alkaline Phosphatase: 91 IU/L (ref 39–117)
BUN/Creatinine Ratio: 12 (ref 9–20)
BUN: 12 mg/dL (ref 6–20)
Bilirubin Total: 0.5 mg/dL (ref 0.0–1.2)
CO2: 27 mmol/L (ref 20–29)
Calcium: 9.6 mg/dL (ref 8.7–10.2)
Chloride: 101 mmol/L (ref 96–106)
Creatinine, Ser: 0.99 mg/dL (ref 0.76–1.27)
GFR calc Af Amer: 125 mL/min/{1.73_m2} (ref >59)
GFR calc non Af Amer: 108 mL/min/{1.73_m2} (ref >59)
Globulin, Total: 2.6 g/dL (ref 1.5–4.5)
Glucose: 76 mg/dL (ref 65–99)
Potassium: 4.1 mmol/L (ref 3.5–5.2)
Sodium: 143 mmol/L (ref 134–144)
Total Protein: 7.4 g/dL (ref 6.0–8.5)

## 2019-08-12 LAB — CBC WITH DIFFERENTIAL/PLATELET
Basophils Absolute: 0.1 10*3/uL (ref 0.0–0.2)
Basos: 1 %
EOS (ABSOLUTE): 0.1 10*3/uL (ref 0.0–0.4)
Eos: 1 %
Hematocrit: 48.1 % (ref 37.5–51.0)
Hemoglobin: 16.1 g/dL (ref 13.0–17.7)
Immature Grans (Abs): 0 10*3/uL (ref 0.0–0.1)
Immature Granulocytes: 0 %
Lymphocytes Absolute: 2.3 10*3/uL (ref 0.7–3.1)
Lymphs: 34 %
MCH: 29.3 pg (ref 26.6–33.0)
MCHC: 33.5 g/dL (ref 31.5–35.7)
MCV: 88 fL (ref 79–97)
Monocytes Absolute: 0.8 10*3/uL (ref 0.1–0.9)
Monocytes: 11 %
Neutrophils Absolute: 3.6 10*3/uL (ref 1.4–7.0)
Neutrophils: 53 %
Platelets: 318 10*3/uL (ref 150–450)
RBC: 5.49 x10E6/uL (ref 4.14–5.80)
RDW: 13.2 % (ref 11.6–15.4)
WBC: 6.7 10*3/uL (ref 3.4–10.8)

## 2019-08-12 LAB — LACTATE DEHYDROGENASE: LDH: 184 IU/L (ref 121–224)

## 2019-08-12 LAB — HAPTOGLOBIN: Haptoglobin: 130 mg/dL (ref 17–317)

## 2019-08-12 LAB — IRON,TIBC AND FERRITIN PANEL
Ferritin: 116 ng/mL (ref 30–400)
Iron Saturation: 19 % (ref 15–55)
Iron: 78 ug/dL (ref 38–169)
Total Iron Binding Capacity: 405 ug/dL (ref 250–450)
UIBC: 327 ug/dL (ref 111–343)

## 2019-08-12 LAB — RETICULOCYTES: Retic Ct Pct: 1 % (ref 0.6–2.6)

## 2019-08-13 ENCOUNTER — Telehealth: Payer: Self-pay | Admitting: Family Medicine

## 2019-08-13 LAB — VITAMIN B12: Vitamin B-12: 198 pg/mL — ABNORMAL LOW (ref 232–1245)

## 2019-08-13 LAB — FOLATE: Folate: 6.5 ng/mL (ref >3.0)

## 2019-08-13 LAB — SPECIMEN STATUS REPORT

## 2019-08-13 NOTE — Telephone Encounter (Signed)
Tried calling pt. He was not available. Mom not on HIPPA. Informed that not all labs were back and that we would call when resulted or pt may call back on Monday.

## 2019-08-16 ENCOUNTER — Encounter: Payer: Self-pay | Admitting: Family Medicine

## 2019-08-19 ENCOUNTER — Other Ambulatory Visit (HOSPITAL_COMMUNITY): Payer: Self-pay | Admitting: *Deleted

## 2019-08-19 NOTE — Progress Notes (Signed)
Labs drawn at PCP office, our orders cancelled.

## 2019-08-23 ENCOUNTER — Other Ambulatory Visit: Payer: Self-pay

## 2019-08-23 ENCOUNTER — Inpatient Hospital Stay (HOSPITAL_COMMUNITY): Payer: 59 | Attending: Hematology | Admitting: Hematology

## 2019-08-23 DIAGNOSIS — D509 Iron deficiency anemia, unspecified: Secondary | ICD-10-CM

## 2019-08-23 DIAGNOSIS — D6941 Evans syndrome: Secondary | ICD-10-CM | POA: Diagnosis not present

## 2019-08-23 NOTE — Progress Notes (Signed)
Virtual Visit via Telephone Note  I connected with Jeremy Ford on 08/23/19 at  4:15 PM EDT by telephone and verified that I am speaking with the correct person using two identifiers.   I discussed the limitations, risks, security and privacy concerns of performing an evaluation and management service by telephone and the availability of in person appointments. I also discussed with the patient that there may be a patient responsible charge related to this service. The patient expressed understanding and agreed to proceed.   History of Present Illness: He was seen in our clinic in July 2019 for follow-up of Jeremy Ford.  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  He responded to IVIG within 72 hours of treatment with platelet count reaching more than 100,000.  He had a relapse again with platelet count less than 10,000 after 11 days without evidence of bleeding.  He was given IVIG with another transient response.  He has completed rituximab therapy last dose 04/11/2016.  He also received intermittent IV iron, last Feraheme was on 08/20/2016.   Observations/Objective: He reports that he is feeling very well.  However he has palpitations at nighttime.  Reportedly his blood pressure is also staying high.  Denies any fevers, night sweats or weight loss.  Reports some tiredness.  Assessment and Plan:  1.  Jeremy Ford: -We reviewed labs from 08/11/2019.  Hemoglobin 16.1.  Platelet count is 318.  MCV is 88. -LDH is 184. -We will check him back again in a year.  We will repeat labs at that time.  He was told to come back sooner if he develops any severe anemia or thrombocytopenia. -He reported palpitations at nighttime.  He was told to talk to his primary doctor to see if he needs evaluation by  cardiology.  2.  Iron deficiency state: -He had a history of iron deficiency with last Feraheme on 08/20/2016. -Labs from 08/11/2019 shows ferritin 116 with percent saturation of 19.  Folic acid was normal.  3.  B12 deficiency: -His B12 was borderline low at 198. -I have suggested him to take B12 1 mg tablet daily.   Follow Up Instructions: RTC 1 year.   I discussed the assessment and treatment plan with the patient. The patient was provided an opportunity to ask questions and all were answered. The patient agreed with the plan and demonstrated an understanding of the instructions.   The patient was advised to call back or seek an in-person evaluation if the symptoms worsen or if the condition fails to improve as anticipated.  I provided 12 minutes of non-face-to-face time during this encounter.   Doreatha Massed, MD

## 2019-09-03 ENCOUNTER — Other Ambulatory Visit (HOSPITAL_COMMUNITY): Payer: Self-pay | Admitting: *Deleted

## 2019-09-03 ENCOUNTER — Telehealth (HOSPITAL_COMMUNITY): Payer: Self-pay | Admitting: *Deleted

## 2019-09-03 DIAGNOSIS — D6941 Evans syndrome: Secondary | ICD-10-CM

## 2019-09-03 NOTE — Telephone Encounter (Signed)
Pt phoned the clinic he has been having nosebleeds off and on x several days.  Last nosebleed yesterday.  He states it is not difficult to get the bleeding under control and only lasts a few minutes.  He does report "big clots" coming up in the back of his throat.  I discussed with Dr. Ellin Saba.  He advises pt come to lab today to get CBC drawn to check platelet count.  Gaspar states "there is no way I can be there today".  He would like to go to [place] on Monday to get CBC drawn and have them fax [place] the results.  I told him that would be okay, but advised, per Dr. Ellin Saba, that if he should have another nosebleed anytime over the weekend he needs to report to [facility] ED for evaluation.  He agrees to do this.  All questions were answered to pt's satisfaction.

## 2019-09-03 NOTE — Addendum Note (Signed)
Addended by: Margorie John on: 09/03/2019 03:26 PM   Modules accepted: Orders

## 2019-09-07 ENCOUNTER — Other Ambulatory Visit: Payer: No Typology Code available for payment source

## 2019-09-07 ENCOUNTER — Other Ambulatory Visit: Payer: Self-pay

## 2019-09-07 DIAGNOSIS — D6941 Evans syndrome: Secondary | ICD-10-CM

## 2019-09-07 LAB — CBC
Hematocrit: 44.3 % (ref 37.5–51.0)
Hemoglobin: 15.6 g/dL (ref 13.0–17.7)
MCH: 29.5 pg (ref 26.6–33.0)
MCHC: 35.2 g/dL (ref 31.5–35.7)
MCV: 84 fL (ref 79–97)
Platelets: 348 10*3/uL (ref 150–450)
RBC: 5.28 x10E6/uL (ref 4.14–5.80)
RDW: 12.8 % (ref 11.6–15.4)
WBC: 6.7 10*3/uL (ref 3.4–10.8)

## 2019-11-09 ENCOUNTER — Other Ambulatory Visit: Payer: Self-pay

## 2019-11-09 ENCOUNTER — Encounter: Payer: Self-pay | Admitting: Family Medicine

## 2019-11-09 ENCOUNTER — Ambulatory Visit: Payer: No Typology Code available for payment source | Admitting: Family Medicine

## 2019-11-09 VITALS — BP 137/77 | HR 74 | Temp 98.4°F | Ht 68.0 in | Wt 211.4 lb

## 2019-11-09 DIAGNOSIS — R030 Elevated blood-pressure reading, without diagnosis of hypertension: Secondary | ICD-10-CM | POA: Diagnosis not present

## 2019-11-09 DIAGNOSIS — E538 Deficiency of other specified B group vitamins: Secondary | ICD-10-CM | POA: Diagnosis not present

## 2019-11-09 DIAGNOSIS — R000 Tachycardia, unspecified: Secondary | ICD-10-CM | POA: Diagnosis not present

## 2019-11-09 NOTE — Patient Instructions (Addendum)
Sinus Tachycardia  Sinus tachycardia is a kind of fast heartbeat. In sinus tachycardia, the heart beats more than 100 times a minute. Sinus tachycardia starts in a part of the heart called the sinus node. Sinus tachycardia may be harmless, or it may be a sign of a serious condition. What are the causes? This condition may be caused by:  Exercise or exertion.  A fever.  Pain.  Loss of body fluids (dehydration).  Severe bleeding (hemorrhage).  Anxiety and stress.  Certain substances, including: ? Alcohol. ? Caffeine. ? Tobacco and nicotine products. ? Cold medicines. ? Illegal drugs.  Medical conditions including: ? Heart disease. ? An infection. ? An overactive thyroid (hyperthyroidism). ? A lack of red blood cells (anemia). What are the signs or symptoms? Symptoms of this condition include:  A feeling that the heart is beating quickly (palpitations).  Suddenly noticing your heartbeat (cardiac awareness).  Dizziness.  Tiredness (fatigue).  Shortness of breath.  Chest pain.  Nausea.  Fainting. How is this diagnosed? This condition is diagnosed with:  A physical exam.  Other tests, such as: ? Blood tests. ? An electrocardiogram (ECG). This test measures the electrical activity of the heart. ? Ambulatory cardiac monitor. This records your heartbeats for 24 hours or more. You may be referred to a heart specialist (cardiologist). How is this treated? Treatment for this condition depends on the cause or the underlying condition. Treatment may involve:  Treating the underlying condition.  Taking new medicines or changing your current medicines as told by your health care provider.  Making changes to your diet or lifestyle. Follow these instructions at home: Lifestyle   Do not use any products that contain nicotine or tobacco, such as cigarettes and e-cigarettes. If you need help quitting, ask your health care provider.  Do not use illegal drugs, such as  cocaine.  Learn relaxation methods to help you when you get stressed or anxious. These include deep breathing.  Avoid caffeine or other stimulants. Alcohol use   Do not drink alcohol if: ? Your health care provider tells you not to drink. ? You are pregnant, may be pregnant, or are planning to become pregnant.  If you drink alcohol, limit how much you have: ? 0-1 drink a day for women. ? 0-2 drinks a day for men.  Be aware of how much alcohol is in your drink. In the [place] one drink equals one typical bottle of beer (12 oz), one-half glass of wine (5 oz), or one shot of hard liquor (1 oz). General instructions  Drink enough fluids to keep your urine pale yellow.  Take over-the-counter and prescription medicines only as told by your health care provider.  Keep all follow-up visits as told by your health care provider. This is important. Contact a health care provider if you have:  A fever.  Vomiting or diarrhea that does not go away. Get help right away if you:  Have pain in your chest, upper arms, jaw, or neck.  Become weak or dizzy.  Feel faint.  Have palpitations that do not go away. Summary  In sinus tachycardia, the heart beats more than 100 times a minute.  Sinus tachycardia may be harmless, or it may be a sign of a serious condition.  Treatment for this condition depends on the cause or the underlying condition.  Get help right away if you have pain in your chest, upper arms, jaw, or neck. This information is not intended to replace advice given to you by   your health care provider. Make sure you discuss any questions you have with your health care provider. Document Revised: 05/21/2017 Document Reviewed: 05/21/2017 Elsevier Patient Education  2020 Elsevier Inc.  

## 2019-11-09 NOTE — Progress Notes (Signed)
Assessment & Plan:  1. Elevated BP without diagnosis of hypertension - Controlled with diet and exercise.  2. Tachycardia - Vitamin B12 - Ambulatory referral to Cardiology  3. B12 deficiency - Vitamin B12   Return for annual physical.  Deliah Boston, MSN, APRN, FNP-C [facility]  Subjective:    Patient ID: Jeremy Ford, male    DOB: 1995-06-27, 24 y.o.   MRN: [id number]  Patient Care Team: Gwenlyn Fudge, FNP as PCP - General (Family Medicine)   Chief Complaint:  Chief Complaint  Patient presents with  . Hypertension    3 month follow up   . Panic Attack    Patietn states that he will been waking up in the middle of the night having panic attacks.  States it has been going on awhile.    HPI: Jeremy Ford is a 24 y.o. male presenting on 11/09/2019 for Hypertension (3 month follow up ) and Panic Attack (Patietn states that he will been waking up in the middle of the night having panic attacks.  States it has been going on awhile.)  Patient is here for follow-up of elevated blood pressure without a diagnosis of hypertension.  He reports the highest blood pressure he has seen since he was last here was 140/81.  He has been trying to exercise more and follow a healthier diet.  New complaints: Patient is concerned that he keeps experiencing intermittent heart racing.  It occurs during the day and at night.  He does have a watch that can monitor his heart rate and states that he will jump up to the 130s.  He is normally in the 80s.  He does report when his heart rate jumps up that he feels a tightness in his chest, but denies pain or shortness of breath.  Patient does have a minor B12 deficiency for which he takes a supplement for.  Social history:  Relevant past medical, surgical, family and social history reviewed and updated as indicated. Interim medical history since our last visit reviewed.  Allergies and medications reviewed and  updated.  DATA REVIEWED: CHART IN EPIC  ROS: Negative unless specifically indicated above in HPI.   No current outpatient medications on file.   Allergies  Allergen Reactions  . Ibuprofen Other (See Comments)    States it messes with his platelets  . Iron     Other reaction(s): Constipation   Past Medical History:  Diagnosis Date  . 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 ADHD (attention deficit hyperactivity disorder)   . 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HCC)   . Iron deficiency anemia 03/18/2016    Past Surgical History:  Procedure Laterality Date  . ADENOIDECTOMY    . TONSILLECTOMY    . TYMPANOSTOMY TUBE PLACEMENT      Social History   Socioeconomic History  . Marital status: Single    Spouse name: Not on file  . Number of children: Not on file  . Years of education: Not on file  . Highest education level: Not on file  Occupational History  . Not on file  Tobacco Use  . Smoking status: Never Smoker  . Smokeless tobacco: Never Used  Vaping Use  . Vaping Use: Never used  Substance and Sexual Activity  . Alcohol use: No  . Drug use: No  . Sexual activity: Never  Other Topics Concern  . Not on file  Social History Narrative   Lives with Mother and brother (52 yrs old); No pets in  the house; No smokers in the house.   Social Determinants of Health   Financial Resource Strain:   . Difficulty of Paying Living Expenses:   Food Insecurity:   . Worried About Programme researcher, broadcasting/film/video in the Last Year:   . Barista in the Last Year:   Transportation Needs:   . Freight forwarder (Medical):   Marland Kitchen Lack of Transportation (Non-Medical):   Physical Activity:   . Days of Exercise per Week:   . Minutes of Exercise per Session:   Stress:   . Feeling of Stress :   Social Connections:   . Frequency of Communication with Friends and Family:   . Frequency of Social Gatherings with Friends and Family:   . Attends Religious Services:   . Active Member of Clubs or Organizations:   . Attends  Banker Meetings:   Marland Kitchen Marital Status:   Intimate Partner Violence:   . Fear of Current or Ex-Partner:   . Emotionally Abused:   Marland Kitchen Physically Abused:   . Sexually Abused:         Objective:    BP (!) 137/77   Pulse 74   Temp 98.4 F (36.9 C) (Temporal)   Ht 5\' 8"  (1.727 m)   Wt (!) 211 lb 6.4 oz (95.9 kg)   SpO2 97%   BMI 32.14 kg/m   Wt Readings from Last 3 Encounters:  11/09/19 (!) 211 lb 6.4 oz (95.9 kg)  08/11/19 211 lb 9.6 oz (96 kg)  11/12/17 236 lb (107 kg) (99 %, Z= 2.17)*   * Growth percentiles are based on CDC (Boys, 2-20 Years) data.    Physical Exam Vitals reviewed.  Constitutional:      General: He is not in acute distress.    Appearance: Normal appearance. He is obese. He is not ill-appearing, toxic-appearing or diaphoretic.  HENT:     Head: Normocephalic and atraumatic.  Eyes:     General: No scleral icterus.       Right eye: No discharge.        Left eye: No discharge.     Conjunctiva/sclera: Conjunctivae normal.  Cardiovascular:     Rate and Rhythm: Normal rate and regular rhythm.     Heart sounds: Normal heart sounds. No murmur heard.  No friction rub. No gallop.   Pulmonary:     Effort: Pulmonary effort is normal. No respiratory distress.     Breath sounds: Normal breath sounds. No stridor. No wheezing, rhonchi or rales.  Musculoskeletal:        General: Normal range of motion.     Cervical back: Normal range of motion.  Skin:    General: Skin is warm and dry.  Neurological:     Mental Status: He is alert and oriented to person, place, and time. Mental status is at baseline.  Psychiatric:        Mood and Affect: Mood normal.        Behavior: Behavior normal.        Thought Content: Thought content normal.        Judgment: Judgment normal.     No results found for: TSH Lab Results  Component Value Date   WBC 6.7 09/07/2019   HGB 15.6 09/07/2019   HCT 44.3 09/07/2019   MCV 84 09/07/2019   PLT 348 09/07/2019   Lab  Results  Component Value Date   NA 143 08/11/2019   K 4.1 08/11/2019   CO2 27  08/11/2019   GLUCOSE 76 08/11/2019   BUN 12 08/11/2019   CREATININE 0.99 08/11/2019   BILITOT 0.5 08/11/2019   ALKPHOS 91 08/11/2019   AST 20 08/11/2019   ALT 22 08/11/2019   PROT 7.4 08/11/2019   ALBUMIN 4.8 08/11/2019   CALCIUM 9.6 08/11/2019   ANIONGAP 7 11/04/2017   No results found for: CHOL No results found for: HDL No results found for: LDLCALC No results found for: TRIG No results found for: CHOLHDL No results found for: [id number]

## 2019-11-10 LAB — VITAMIN B12: Vitamin B-12: 288 pg/mL (ref 232–1245)

## 2019-12-21 ENCOUNTER — Encounter: Payer: No Typology Code available for payment source | Admitting: Family Medicine

## 2019-12-28 NOTE — Progress Notes (Signed)
Cardiology Office Note   Date:  12/29/2019   ID:  [name], DOB 1996-03-08, MRN [id number]  PCP:  Gwenlyn Fudge, FNP  Cardiologist:   No primary care provider on file. Referring:  Gwenlyn Fudge, FNP  Chief Complaint  Patient presents with  . Chest Pain      History of Present Illness: Jeremy Ford is a 24 y.o. male who is referred by Gwenlyn Fudge, FNP for evaluation of chest pain.  He has no past cardiac history.  He has been getting chest discomfort off and on for some time though is not exactly sure when.  Seems to be worse more recently.  He described intermittent sharp chest discomfort.  Sometimes waking him from his sleep.  He might have some rapid heart rate with it but he describes a rate of about 109 on his apple watch.  His symptoms may occur during the day and rest.  He is not able to bring them on.  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  He denies any inducible symptoms such as shortness of breath, PND or orthopnea.  He has no palpitations, presyncope or syncope.  He is accompanied by his mom warning of some of this could be anxiety.  He has had no prior cardiac work-up in the past.  Past Medical History:  Diagnosis Date  . 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 ADHD (attention deficit hyperactivity disorder)   . 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HCC)   . Iron deficiency anemia 03/18/2016    Past Surgical History:  Procedure Laterality Date  . ADENOIDECTOMY    . TONSILLECTOMY    . TYMPANOSTOMY TUBE PLACEMENT       Current Outpatient Medications  Medication Sig Dispense Refill  . VITAMIN D, ERGOCALCIFEROL, PO Take by mouth daily.     No current facility-administered medications for this visit.    Allergies:   Ibuprofen and Iron    Social History:  The patient  reports that he has never smoked. He has never used smokeless tobacco. He reports that he does not drink alcohol and does not use drugs.   Family History:  The patient's family  history includes Asthma in his mother and paternal grandmother; Bladder Cancer in an other family member; Breast cancer in his maternal grandmother; Colon cancer in his maternal aunt; Colon polyps in his father; Diabetes in his maternal grandfather, mother, paternal grandfather, and paternal grandmother; Hyperlipidemia in his paternal grandfather; Hypertension in his maternal grandfather, mother, paternal grandfather, and paternal grandmother; Lung cancer in his paternal uncle; Skin cancer in his father and paternal grandfather; Throat cancer in an other family member.    ROS:  Please see the history of present illness.   Otherwise, review of systems are positive for none.   All other systems are reviewed and negative.    PHYSICAL EXAM: VS:  BP (!) 160/100   Pulse 86   Ht 5\' 8"  (1.727 m)   Wt 218 lb (98.9 kg)   BMI 33.15 kg/m  , BMI Body mass index is 33.15 kg/m. GENERAL:  Well appearing HEENT:  Pupils equal round and reactive, fundi not visualized, oral mucosa unremarkable NECK:  No jugular venous distention, waveform within normal limits, carotid upstroke brisk and symmetric, no bruits, no thyromegaly LYMPHATICS:  No cervical, inguinal adenopathy LUNGS:  Clear to auscultation bilaterally BACK:  No CVA tenderness CHEST:  Unremarkable HEART:  PMI not displaced or sustained,S1 and S2 within  normal limits, no S3, no S4, no clicks, no rubs, no murmurs ABD:  Flat, positive bowel sounds normal in frequency in pitch, no bruits, no rebound, no guarding, no midline pulsatile mass, no hepatomegaly, no splenomegaly EXT:  2 plus pulses throughout, no edema, no cyanosis no clubbing SKIN:  No rashes no nodules NEURO:  Cranial nerves II through XII grossly intact, motor grossly intact throughout PSYCH:  Cognitively intact, oriented to person place and time    EKG:  EKG is ordered today. The ekg ordered today demonstrates sinus rhythm, rate 86, axis within normal limits, intervals within normal  limits, no acute ST-T wave changes.   Recent Labs: 08/11/2019: ALT 22; BUN 12; Creatinine, Ser 0.99; Potassium 4.1; Sodium 143 09/07/2019: Hemoglobin 15.6; Platelets 348    Lipid Panel No results found for: CHOL, TRIG, HDL, CHOLHDL, VLDL, LDLCALC, LDLDIRECT    Wt Readings from Last 3 Encounters:  12/29/19 219 lb 6.4 oz (99.5 kg)  12/29/19 218 lb (98.9 kg)  11/09/19 (!) 211 lb 6.4 oz (95.9 kg)      Other studies Reviewed: Additional studies/ records that were reviewed today include: Labs. Review of the above records demonstrates:  Please see elsewhere in the note.     ASSESSMENT AND PLAN:  CHEST PAIN:    The patient has some atypical chest pain. I will bring the patient back for a POET (Plain Old Exercise Test). This will allow me to screen for obstructive coronary disease, risk stratify and very importantly provide a prescription for exercise.  HTN: His blood pressure is elevated today but they state is unusual.  He is going to check it 3 times a day for the next 2 weeks and I will review this diary as he might need treatment.  TACHYCARDIA: He has some vague symptoms of tachycardia with heart rates in the 100s at rest waking him from his sleep at times.  We will keep a watch with his Apple device.  I will check a TSH.  COVID EDUCATION: He has not been vaccinated.  We had a long discussion about this and hopefully he will.   Current medicines are reviewed at length with the patient today.  The patient does not have concerns regarding medicines.  The following changes have been made:  no change  Labs/ tests ordered today include:   Orders Placed This Encounter  Procedures  . TSH  . EXERCISE TOLERANCE TEST (ETT)  . EKG 12-Lead     Disposition:   FU with me as needed.      Signed, Rollene Rotunda, MD  12/29/2019 3:10 PM    [facility]

## 2019-12-29 ENCOUNTER — Encounter: Payer: Self-pay | Admitting: Cardiology

## 2019-12-29 ENCOUNTER — Ambulatory Visit (INDEPENDENT_AMBULATORY_CARE_PROVIDER_SITE_OTHER): Payer: No Typology Code available for payment source | Admitting: Cardiology

## 2019-12-29 ENCOUNTER — Encounter: Payer: Self-pay | Admitting: Family Medicine

## 2019-12-29 ENCOUNTER — Other Ambulatory Visit: Payer: Self-pay

## 2019-12-29 ENCOUNTER — Ambulatory Visit (INDEPENDENT_AMBULATORY_CARE_PROVIDER_SITE_OTHER): Payer: No Typology Code available for payment source | Admitting: Family Medicine

## 2019-12-29 VITALS — BP 160/100 | HR 86 | Ht 68.0 in | Wt 218.0 lb

## 2019-12-29 VITALS — BP 135/82 | HR 95 | Temp 98.2°F | Ht 68.0 in | Wt 219.4 lb

## 2019-12-29 DIAGNOSIS — R000 Tachycardia, unspecified: Secondary | ICD-10-CM

## 2019-12-29 DIAGNOSIS — Z7189 Other specified counseling: Secondary | ICD-10-CM

## 2019-12-29 DIAGNOSIS — D6941 Evans syndrome: Secondary | ICD-10-CM | POA: Diagnosis not present

## 2019-12-29 DIAGNOSIS — E538 Deficiency of other specified B group vitamins: Secondary | ICD-10-CM

## 2019-12-29 DIAGNOSIS — Z23 Encounter for immunization: Secondary | ICD-10-CM

## 2019-12-29 DIAGNOSIS — E669 Obesity, unspecified: Secondary | ICD-10-CM

## 2019-12-29 DIAGNOSIS — Z1159 Encounter for screening for other viral diseases: Secondary | ICD-10-CM

## 2019-12-29 NOTE — Progress Notes (Signed)
Assessment & Plan:  1. Obesity (BMI 30.0-34.9) - Encouraged diet and exercise. - Lipid panel  2. 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  3. Vitamin B12 deficiency - Well controlled on current regimen.  - Cyanocobalamin (VITAMIN B-12 PO); Take by mouth daily.  4. Tachycardia - Lab ordered by cardiology. - TSH  5. Immunization due - Tdap vaccine greater than or equal to 22yo IM  6. Encounter for hepatitis C screening test for low risk patient - [facility] HEPATIT[facility] SCREENING LAB   Return for on/after 08/11/2019 for annual physical or sooner if needed.  Deliah Boston, MSN, APRN, FNP-C [facility]  Subjective:    Patient ID: Jeremy Ford, male    DOB: [birth year], 24 y.o.   MRN: [id number]  Patient Care Team: Gwenlyn Fudge, FNP as PCP - General (Family Medicine)   Chief Complaint:  Chief Complaint  Patient presents with  . Annual Exam    HPI: Jeremy Ford is a 24 y.o. male presenting on 12/29/2019 for Annual Exam  Patient states he is here for his annual physical, but this was completed on 08/11/2019.  [name] syndrome is managed by his hematologist.  His blood pressure was elevated at his first visit with me, at which time he was advised to keep a log of his blood pressures and bring it with him to his next visit.  He did this and was felt to be controlled with diet and exercise, which he was working on. He did have his first visit with cardiology this morning at which time his blood pressure was 160/100.  Dr. Antoine Poche advised patient to check his blood pressure TID for the next 2 weeks and keep a log which he will bring with him to his next appointment with cardiology.   Patient takes a daily vitamin B12 supplement for mild deficiency.  New complaints: No complaints, but would like to get his labs ordered by Dr. Antoine Poche while he is here today.   Social history:  Relevant past medical, surgical, family and social history reviewed  and updated as indicated. Interim medical history since our last visit reviewed.  Allergies and medications reviewed and updated.  DATA REVIEWED: CHART IN EPIC  ROS: Negative unless specifically indicated above in HPI.   No current outpatient medications on file.   Allergies  Allergen Reactions  . Ibuprofen Other (See Comments)    States it messes with his platelets  . Iron     Other reaction(s): Constipation   Past Medical History:  Diagnosis Date  . 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 ADHD (attention deficit hyperactivity disorder)   . 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HCC)   . Hyperlipidemia 12/31/2019  . Iron deficiency anemia 03/18/2016    Past Surgical History:  Procedure Laterality Date  . ADENOIDECTOMY    . TONSILLECTOMY    . TYMPANOSTOMY TUBE PLACEMENT      Social History   Socioeconomic History  . Marital status: Single    Spouse name: Not on file  . Number of children: Not on file  . Years of education: Not on file  . Highest education level: Not on file  Occupational History  . Not on file  Tobacco Use  . Smoking status: Never Smoker  . Smokeless tobacco: Never Used  Vaping Use  . Vaping Use: Never used  Substance and Sexual Activity  . Alcohol use: No  . Drug use: No  . Sexual activity: Never  Other Topics Concern  . Not  on file  Social History Narrative   Lives with Mother and brother ; No pets in the house; No smokers in the house.   Social Determinants of Health   Financial Resource Strain:   . Difficulty of Paying Living Expenses: Not on file  Food Insecurity:   . Worried About Programme researcher, broadcasting/film/video in the Last Year: Not on file  . Ran Out of Food in the Last Year: Not on file  Transportation Needs:   . Lack of Transportation (Medical): Not on file  . Lack of Transportation (Non-Medical): Not on file  Physical Activity:   . Days of Exercise per Week: Not on file  . Minutes of Exercise per Session: Not on file  Stress:   . Feeling of Stress : Not on file    Social Connections:   . Frequency of Communication with Friends and Family: Not on file  . Frequency of Social Gatherings with Friends and Family: Not on file  . Attends Religious Services: Not on file  . Active Member of Clubs or Organizations: Not on file  . Attends Banker Meetings: Not on file  . Marital Status: Not on file  Intimate Partner Violence:   . Fear of Current or Ex-Partner: Not on file  . Emotionally Abused: Not on file  . Physically Abused: Not on file  . Sexually Abused: Not on file        Objective:    BP 135/82   Pulse 95   Temp 98.2 F (36.8 C) (Temporal)   Ht 5\' 8"  (1.727 m)   Wt 219 lb 6.4 oz (99.5 kg)   SpO2 97%   BMI 33.36 kg/m   Wt Readings from Last 3 Encounters:  12/29/19 219 lb 6.4 oz (99.5 kg)  12/29/19 218 lb (98.9 kg)  11/09/19 (!) 211 lb 6.4 oz (95.9 kg)    Physical Exam Vitals reviewed.  Constitutional:      General: He is not in acute distress.    Appearance: Normal appearance. He is obese. He is not ill-appearing, toxic-appearing or diaphoretic.  HENT:     Head: Normocephalic and atraumatic.  Eyes:     General: No scleral icterus.       Right eye: No discharge.        Left eye: No discharge.     Conjunctiva/sclera: Conjunctivae normal.  Cardiovascular:     Rate and Rhythm: Normal rate and regular rhythm.     Heart sounds: Normal heart sounds. No murmur heard.  No friction rub. No gallop.   Pulmonary:     Effort: Pulmonary effort is normal. No respiratory distress.     Breath sounds: Normal breath sounds. No stridor. No wheezing, rhonchi or rales.  Musculoskeletal:        General: Normal range of motion.     Cervical back: Normal range of motion.  Skin:    General: Skin is warm and dry.  Neurological:     Mental Status: He is alert and oriented to person, place, and time. Mental status is at baseline.  Psychiatric:        Mood and Affect: Mood normal.        Behavior: Behavior normal.        Thought  Content: Thought content normal.        Judgment: Judgment normal.     Lab Results  Component Value Date   TSH 0.580 12/29/2019   Lab Results  Component Value Date   WBC 6.7  09/07/2019   HGB 15.6 09/07/2019   HCT 44.3 09/07/2019   MCV 84 09/07/2019   PLT 348 09/07/2019   Lab Results  Component Value Date   NA 143 08/11/2019   K 4.1 08/11/2019   CO2 27 08/11/2019   GLUCOSE 76 08/11/2019   BUN 12 08/11/2019   CREATININE 0.99 08/11/2019   BILITOT 0.5 08/11/2019   ALKPHOS 91 08/11/2019   AST 20 08/11/2019   ALT 22 08/11/2019   PROT 7.4 08/11/2019   ALBUMIN 4.8 08/11/2019   CALCIUM 9.6 08/11/2019   ANIONGAP 7 11/04/2017   Lab Results  Component Value Date   CHOL 213 (H) 12/29/2019   Lab Results  Component Value Date   HDL 40 12/29/2019   Lab Results  Component Value Date   LDLCALC 141 (H) 12/29/2019   Lab Results  Component Value Date   TRIG 176 (H) 12/29/2019   Lab Results  Component Value Date   CHOLHDL 5.3 (H) 12/29/2019   No results found for: HGBA1C

## 2019-12-29 NOTE — Patient Instructions (Addendum)
Medication Instructions:  The current medical regimen is effective;  continue present plan and medications.  *If you need a refill on your cardiac medications before your next appointment, please call your pharmacy*  Lab Work: Please have blood work today (TSH).  If you have labs (blood work) drawn today and your tests are completely normal, you will receive your results only by: Marland Kitchen MyChart Message (if you have MyChart) OR . A paper copy in the mail If you have any lab test that is abnormal or we need to change your treatment, we will call you to review the results.  Testing/Procedures: Your physician has requested that you have an exercise tolerance test.  Please check in at the Main Entrance of [facility].  Nothing to eat/drink 4 hours before.  You may take your medications as normal with sips of water.  You will need Covid screening before your stress test which will be completed at the drive-through testing site at [facility].  You will need to self quarantine from the time of the screening until the time of your stress test.  Follow-Up: At [facility], you and your health needs are our priority.  As part of our continuing mission to provide you with exceptional heart care, we have created designated Provider Care Teams.  These Care Teams include your primary Cardiologist (physician) and Advanced Practice Providers (APPs -  Physician Assistants and Nurse Practitioners) who all work together to provide you with the care you need, when you need it.  We recommend signing up for the patient portal called "MyChart".  Sign up information is provided on this After Visit Summary.  MyChart is used to connect with patients for Virtual Visits (Telemedicine).  Patients are able to view lab/test results, encounter notes, upcoming appointments, etc.  Non-urgent messages can be sent to your provider as well.   To learn more about what you can do with MyChart, go to [email or web address].     Follow up as needed with Dr Antoine Poche.  Thank you for choosing [facility]!!

## 2019-12-30 ENCOUNTER — Telehealth: Payer: Self-pay | Admitting: Family Medicine

## 2019-12-30 LAB — LIPID PANEL
Chol/HDL Ratio: 5.3 ratio — ABNORMAL HIGH (ref 0.0–5.0)
Cholesterol, Total: 213 mg/dL — ABNORMAL HIGH (ref 100–199)
HDL: 40 mg/dL (ref >39)
LDL Chol Calc (NIH): 141 mg/dL — ABNORMAL HIGH (ref 0–99)
Triglycerides: 176 mg/dL — ABNORMAL HIGH (ref 0–149)
VLDL Cholesterol Cal: 32 mg/dL (ref 5–40)

## 2019-12-30 LAB — TSH: TSH: 0.58 u[IU]/mL (ref 0.450–4.500)

## 2019-12-30 NOTE — Telephone Encounter (Signed)
Patient aware waiting on labs to be revived.

## 2019-12-31 ENCOUNTER — Encounter: Payer: Self-pay | Admitting: Family Medicine

## 2019-12-31 DIAGNOSIS — E785 Hyperlipidemia, unspecified: Secondary | ICD-10-CM

## 2019-12-31 HISTORY — DX: Hyperlipidemia, unspecified: E78.5

## 2020-01-01 ENCOUNTER — Encounter: Payer: Self-pay | Admitting: Family Medicine

## 2020-01-01 DIAGNOSIS — E669 Obesity, unspecified: Secondary | ICD-10-CM | POA: Insufficient documentation

## 2020-01-05 NOTE — Telephone Encounter (Signed)
Aware of lab results and recommendations  

## 2020-01-07 ENCOUNTER — Other Ambulatory Visit (HOSPITAL_COMMUNITY)
Admission: RE | Admit: 2020-01-07 | Discharge: 2020-01-07 | Disposition: A | Discharge status: Home / Self Care | Payer: No Typology Code available for payment source | Source: Ambulatory Visit | Attending: Cardiology | Admitting: Cardiology

## 2020-01-07 NOTE — Progress Notes (Signed)
Left message with patient's voicemail. Pt. missed covid screening 01/07/2020. "Please call your Dr's office and have them reschedule your covid appointment and your appt. With the Dr's office.." Nothing further needed.

## 2020-01-10 ENCOUNTER — Ambulatory Visit (HOSPITAL_COMMUNITY): Admission: RE | Admit: 2020-01-10 | Payer: No Typology Code available for payment source | Source: Ambulatory Visit

## 2020-02-11 ENCOUNTER — Telehealth: Payer: Self-pay

## 2020-02-11 NOTE — Telephone Encounter (Signed)
Patient aware to take otc.

## 2020-06-07 ENCOUNTER — Other Ambulatory Visit (HOSPITAL_COMMUNITY): Payer: Self-pay

## 2020-06-07 DIAGNOSIS — D509 Iron deficiency anemia, unspecified: Secondary | ICD-10-CM

## 2020-06-08 ENCOUNTER — Telehealth: Payer: Self-pay

## 2020-06-08 ENCOUNTER — Other Ambulatory Visit: Payer: Self-pay

## 2020-06-08 ENCOUNTER — Inpatient Hospital Stay (HOSPITAL_COMMUNITY): Payer: No Typology Code available for payment source | Attending: Hematology

## 2020-06-08 DIAGNOSIS — D6941 Evans syndrome: Secondary | ICD-10-CM | POA: Insufficient documentation

## 2020-06-08 DIAGNOSIS — E669 Obesity, unspecified: Secondary | ICD-10-CM | POA: Diagnosis not present

## 2020-06-08 DIAGNOSIS — D5919 Other autoimmune hemolytic anemia: Secondary | ICD-10-CM | POA: Diagnosis not present

## 2020-06-08 DIAGNOSIS — D509 Iron deficiency anemia, unspecified: Secondary | ICD-10-CM | POA: Diagnosis not present

## 2020-06-08 LAB — CBC WITH DIFFERENTIAL/PLATELET
Abs Immature Granulocytes: 0.02 10*3/uL (ref 0.00–0.07)
Basophils Absolute: 0 10*3/uL (ref 0.0–0.1)
Basophils Relative: 1 %
Eosinophils Absolute: 0.1 10*3/uL (ref 0.0–0.5)
Eosinophils Relative: 1 %
HCT: 46.7 % (ref 39.0–52.0)
Hemoglobin: 15.8 g/dL (ref 13.0–17.0)
Immature Granulocytes: 0 %
Lymphocytes Relative: 39 %
Lymphs Abs: 3.1 10*3/uL (ref 0.7–4.0)
MCH: 29.3 pg (ref 26.0–34.0)
MCHC: 33.8 g/dL (ref 30.0–36.0)
MCV: 86.6 fL (ref 80.0–100.0)
Monocytes Absolute: 0.9 10*3/uL (ref 0.1–1.0)
Monocytes Relative: 11 %
Neutro Abs: 3.9 10*3/uL (ref 1.7–7.7)
Neutrophils Relative %: 48 %
Platelets: 292 10*3/uL (ref 150–400)
RBC: 5.39 MIL/uL (ref 4.22–5.81)
RDW: 12.7 % (ref 11.5–15.5)
WBC: 8 10*3/uL (ref 4.0–10.5)
nRBC: 0 % (ref 0.0–0.2)

## 2020-06-08 LAB — IRON AND TIBC
Iron: 61 ug/dL (ref 45–182)
Saturation Ratios: 15 % — ABNORMAL LOW (ref 17.9–39.5)
TIBC: 419 ug/dL (ref 250–450)
UIBC: 358 ug/dL

## 2020-06-08 LAB — FERRITIN: Ferritin: 88 ng/mL (ref 24–336)

## 2020-06-08 LAB — VITAMIN B12: Vitamin B-12: 187 pg/mL (ref 180–914)

## 2020-06-08 LAB — LACTATE DEHYDROGENASE: LDH: 135 U/L (ref 98–192)

## 2020-06-08 NOTE — Telephone Encounter (Signed)
His lab work looks good.

## 2020-06-08 NOTE — Telephone Encounter (Signed)
Patient had labs done yesterday at [facility] he would like you to look at them. Pt was told by front staff here that he could not get his labs drawn and would not even send you a message. Pt states he wants to see if you think he needs treatment because the cancer did not think he did. He can not take iron pill would like for you to really look at platelets. Patient states if we can not get in touch with him he would like for [place] to speak with his mom Jeremy Ford. Please advise.

## 2020-06-08 NOTE — Telephone Encounter (Signed)
Pt asked to talk to B Joyce's nurse did not want to say why. Please call back

## 2020-06-09 NOTE — Telephone Encounter (Signed)
Patient aware.

## 2020-06-09 NOTE — Telephone Encounter (Signed)
Lmtcb.

## 2020-08-21 ENCOUNTER — Inpatient Hospital Stay (HOSPITAL_COMMUNITY): Payer: No Typology Code available for payment source | Attending: Hematology

## 2020-08-21 ENCOUNTER — Other Ambulatory Visit: Payer: Self-pay

## 2020-08-21 DIAGNOSIS — D6941 Evans syndrome: Secondary | ICD-10-CM | POA: Diagnosis present

## 2020-08-21 DIAGNOSIS — E538 Deficiency of other specified B group vitamins: Secondary | ICD-10-CM | POA: Insufficient documentation

## 2020-08-21 DIAGNOSIS — D509 Iron deficiency anemia, unspecified: Secondary | ICD-10-CM

## 2020-08-21 DIAGNOSIS — E669 Obesity, unspecified: Secondary | ICD-10-CM | POA: Insufficient documentation

## 2020-08-21 DIAGNOSIS — D591 Autoimmune hemolytic anemia, unspecified: Secondary | ICD-10-CM | POA: Insufficient documentation

## 2020-08-21 LAB — CBC WITH DIFFERENTIAL/PLATELET
Abs Immature Granulocytes: 0.03 10*3/uL (ref 0.00–0.07)
Basophils Absolute: 0 10*3/uL (ref 0.0–0.1)
Basophils Relative: 1 %
Eosinophils Absolute: 0 10*3/uL (ref 0.0–0.5)
Eosinophils Relative: 0 %
HCT: 49.1 % (ref 39.0–52.0)
Hemoglobin: 16.8 g/dL (ref 13.0–17.0)
Immature Granulocytes: 1 %
Lymphocytes Relative: 23 %
Lymphs Abs: 1.5 10*3/uL (ref 0.7–4.0)
MCH: 30 pg (ref 26.0–34.0)
MCHC: 34.2 g/dL (ref 30.0–36.0)
MCV: 87.7 fL (ref 80.0–100.0)
Monocytes Absolute: 0.6 10*3/uL (ref 0.1–1.0)
Monocytes Relative: 9 %
Neutro Abs: 4.2 10*3/uL (ref 1.7–7.7)
Neutrophils Relative %: 66 %
Platelets: 318 10*3/uL (ref 150–400)
RBC: 5.6 MIL/uL (ref 4.22–5.81)
RDW: 12.8 % (ref 11.5–15.5)
WBC: 6.3 10*3/uL (ref 4.0–10.5)
nRBC: 0 % (ref 0.0–0.2)

## 2020-08-21 LAB — IRON AND TIBC
Iron: 78 ug/dL (ref 45–182)
Saturation Ratios: 17 % — ABNORMAL LOW (ref 17.9–39.5)
TIBC: 461 ug/dL — ABNORMAL HIGH (ref 250–450)
UIBC: 383 ug/dL

## 2020-08-21 LAB — VITAMIN B12: Vitamin B-12: 424 pg/mL (ref 180–914)

## 2020-08-21 LAB — LACTATE DEHYDROGENASE: LDH: 130 U/L (ref 98–192)

## 2020-08-21 LAB — FERRITIN: Ferritin: 91 ng/mL (ref 24–336)

## 2020-08-28 ENCOUNTER — Inpatient Hospital Stay (HOSPITAL_BASED_OUTPATIENT_CLINIC_OR_DEPARTMENT_OTHER): Payer: No Typology Code available for payment source | Admitting: Hematology

## 2020-08-28 ENCOUNTER — Other Ambulatory Visit: Payer: Self-pay

## 2020-08-28 VITALS — BP 144/84 | HR 74 | Resp 16 | Wt 215.0 lb

## 2020-08-28 DIAGNOSIS — D509 Iron deficiency anemia, unspecified: Secondary | ICD-10-CM | POA: Diagnosis not present

## 2020-08-28 DIAGNOSIS — D6941 Evans syndrome: Secondary | ICD-10-CM

## 2020-08-28 NOTE — Progress Notes (Signed)
[facility] [address], [place] [address]   CLINIC:  Medical Oncology/Hematology  PCP:  Gwenlyn Fudge, FNP [address] [place] [address]  [phone]  REASON FOR VISIT:  Follow-up for Jeremy Ford and IDA  PRIOR THERAPY: Rituxan therapy, last dose 04/11/16  CURRENT THERAPY: surveillance  INTERVAL HISTORY:  [name], a 25 y.o. male, returns for routine follow-up for his Jeremy Ford and IDA. Jeremy Ford was last seen on 07//31/2019.  Today he reports feeling good, and today he is accompanied by his mother. 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 He reports fatigue, but his mother says it could be attributed to work hours. He denies night sweats, bruising, or any unexpected weight loss.  REVIEW OF SYSTEMS:  Review of Systems  Constitutional: Positive for fatigue. Negative for appetite change.  All other systems reviewed and are negative.   PAST MEDICAL/SURGICAL HISTORY:  Past Medical History:  Diagnosis Date  . 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 ADHD (attention deficit hyperactivity disorder)   . Jeremy Ford (HCC)   . Hyperlipidemia 12/31/2019  . Iron deficiency anemia 03/18/2016   Past Surgical History:  Procedure Laterality Date  . ADENOIDECTOMY    . TONSILLECTOMY    . TYMPANOSTOMY TUBE PLACEMENT      SOCIAL HISTORY:  Social History   Socioeconomic History  . Marital status: Single    Spouse name: Not on file  . Number of children: Not on file  . Years of education: Not on file  . Highest education level: Not on file  Occupational History  . Not on file  Tobacco Use  . Smoking status: Never Smoker  . Smokeless tobacco: Never Used  Vaping Use  . Vaping Use: Never used  Substance and Sexual Activity  . Alcohol use: No  . Drug use: No  . Sexual activity: Never  Other Topics Concern  . Not on file  Social History Narrative   Lives with Mother and brother ; No pets in the house; No smokers in the house.    Social Determinants of Health   Financial Resource Strain: Not on file  Food Insecurity: Not on file  Transportation Needs: Not on file  Physical Activity: Not on file  Stress: Not on file  Social Connections: Not on file  Intimate Partner Violence: Not on file    FAMILY HISTORY:  Family History  Problem Relation Age of Onset  . Breast cancer Maternal Grandmother   . Diabetes Maternal Grandfather   . Hypertension Maternal Grandfather   . Diabetes Paternal Grandmother   . Asthma Paternal Grandmother   . Hypertension Paternal Grandmother   . Asthma Mother   . Diabetes Mother   . Hypertension Mother   . Skin cancer Father   . Colon polyps Father   . Colon cancer Maternal Aunt   . Lung cancer Paternal Uncle   . Diabetes Paternal Grandfather   . Hypertension Paternal Grandfather   . Hyperlipidemia Paternal Grandfather   . Skin cancer Paternal Grandfather   . Bladder Cancer Other   . Throat cancer Other     CURRENT MEDICATIONS:  Current Outpatient Medications  Medication Sig Dispense Refill  . Cyanocobalamin (VITAMIN B-12 PO) Take by mouth daily.     No current facility-administered medications for this visit.    ALLERGIES:  Allergies  Allergen Reactions  . Ibuprofen Other (See Comments)    States it messes with his platelets  . Iron  Other reaction(s): Constipation    PHYSICAL EXAM:  Performance status (ECOG): 0 - Asymptomatic  There were no vitals filed for this visit. Wt Readings from Last 3 Encounters:  12/29/19 219 lb 6.4 oz (99.5 kg)  12/29/19 218 lb (98.9 kg)  11/09/19 (!) 211 lb 6.4 oz (95.9 kg)   Physical Exam Vitals reviewed.  Constitutional:      Appearance: Normal appearance.  Cardiovascular:     Rate and Rhythm: Normal rate and regular rhythm.     Pulses: Normal pulses.     Heart sounds: Normal heart sounds.  Pulmonary:     Effort: Pulmonary effort is normal.     Breath sounds: Normal breath sounds.  Chest:  Breasts:     Right:  No axillary adenopathy or supraclavicular adenopathy.     Left: No axillary adenopathy or supraclavicular adenopathy.    Abdominal:     Palpations: Abdomen is soft. There is no hepatomegaly, splenomegaly or mass.     Tenderness: There is no abdominal tenderness.  Musculoskeletal:     Right lower leg: No edema.     Left lower leg: No edema.  Lymphadenopathy:     Cervical: No cervical adenopathy.     Right cervical: No superficial cervical adenopathy.    Left cervical: No superficial cervical adenopathy.     Upper Body:     Right upper body: No supraclavicular, axillary or pectoral adenopathy.     Left upper body: No supraclavicular, axillary or pectoral adenopathy.  Neurological:     General: No focal deficit present.     Mental Status: He is alert and oriented to person, place, and time.  Psychiatric:        Mood and Affect: Mood normal.        Behavior: Behavior normal.      LABORATORY DATA:  I have reviewed the labs as listed.  CBC Latest Ref Rng & Units 08/21/2020 06/08/2020 09/07/2019  WBC 4.0 - 10.5 K/uL 6.3 8.0 6.7  Hemoglobin 13.0 - 17.0 g/dL 78.2 42.3 53.6  Hematocrit 39.0 - 52.0 % 49.1 46.7 44.3  Platelets 150 - 400 K/uL 318 292 348   CMP Latest Ref Rng & Units 08/11/2019 11/04/2017 05/05/2017  Glucose 65 - 99 mg/dL 76 144(R) 154(M)  BUN 6 - 20 mg/dL 12 11 10   Creatinine 0.76 - 1.27 mg/dL 0.86 7.61  Sodium 134 - 144 mmol/L 143 141 139  Potassium 3.5 - 5.2 mmol/L 4.1 3.9 4.1  Chloride 96 - 106 mmol/L 101 104 102  CO2 20 - 29 mmol/L 27 30 28   Calcium 8.7 - 10.2 mg/dL 9.6 9.7 9.6  Total Protein 6.0 - 8.5 g/dL 7.4 9.50) )  Total Bilirubin 0.0 - 1.2 mg/dL 0.5 0.7 0.9  Alkaline Phos 39 - 117 IU/L 91 91 90  AST 0 - 40 IU/L 20 23 25   ALT 0 - 44 IU/L 22 29 32      Component Value Date/Time   RBC 5.60 08/21/2020 1421   MCV 87.7 08/21/2020 1421   MCV 84 09/07/2019 1300   MCH 30.0 08/21/2020 1421   MCHC 34.2 08/21/2020 1421   RDW 12.8 08/21/2020 1421   RDW  12.8 09/07/2019 1300   LYMPHSABS 1.5 08/21/2020 1421   LYMPHSABS 2.3 08/11/2019 1343   MONOABS 0.6 08/21/2020 1421   EOSABS 0.0 08/21/2020 1421   EOSABS 0.1 08/11/2019 1343   BASOSABS 0.0 08/21/2020 1421   BASOSABS 0.1 08/11/2019 1343    DIAGNOSTIC IMAGING:  I have  independently reviewed the scans and discussed with the patient. No results found.   ASSESSMENT:  1.  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  Initially diagnosed on 02/15/2016 when he presented to [facility] pediatric service with epistaxis and mucosal hematomas with a platelet count of < 10,000.  He responded to IVIG with platelet count reaching > 100,000 within 72 hours of treatment, BUT 11 days after treatment, he again relapsed with a platelet count < 10,000 without evidence of bleeding.  He was re-admitted and given IVIG again with ANOTHER TRANSIENT response.  After an initial drop in HGB from 13 to 8 g/dL during his initial hospitalization, his HGB has been between 9-10 g/dL without transfusion.  DAT positive for IgG (PRIOR to IVIG).  Markers for hemolysis have been consistently negative. Completed Rituxan therapy; last dose 04/11/16.     PLAN:  1. Jeremy Ford: - He does not report any easy bruising or bleeding. - He is working 12-hour shifts at [employer].  He complains of feeling somewhat tired. - Reviewed his labs from 08/21/2020.  Hemoglobin is 16.8 and platelet count is 318.  White count is normal.  LDH was 130. - RTC 1 year for follow-up.  2.  Low B12 levels: - B12 today is 424, improved from 187 at last visit.  Continue B12 1 mg tablet daily. - Ferritin is normal at 91 and hemoglobin 16.8.   Orders placed this encounter:  No orders of the defined types were placed in this encounter.    Doreatha Massed, MD [facility] [phone]   I, Alda Ponder, am acting as a scribe for Dr. Doreatha Massed.  I, Doreatha Massed MD, have reviewed the above documentation for accuracy and completeness, and I agree with the above.

## 2020-08-28 NOTE — Patient Instructions (Signed)
[facility] at [facility] Discharge Instructions  You were seen today by Dr. Ellin Saba. He went over your recent results. Continue taking B-12 daily. Dr. Ellin Saba will see you back in 1 year for labs and follow up.   Thank you for choosing [facility] at [facility] to provide your oncology and hematology care.  To afford each patient quality time with our provider, please arrive at least 15 minutes before your scheduled appointment time.   If you have a lab appointment with the Cancer Center please come in thru the Main Entrance and check in at the main information desk  You need to re-schedule your appointment should you arrive 10 or more minutes late.  We strive to give you quality time with our providers, and arriving late affects you and other patients whose appointments are after yours.  Also, if you no show three or more times for appointments you may be dismissed from the clinic at the providers discretion.     Again, thank you for choosing [facility].  Our hope is that these requests will decrease the amount of time that you wait before being seen by our physicians.       _____________________________________________________________  Should you have questions after your visit to [facility], please contact our office at [phone] between the hours of 8:00 a.m. and 4:30 p.m.  Voicemails left after 4:00 p.m. will not be returned until the following business day.  For prescription refill requests, have your pharmacy contact our office and allow 72 hours.    Cancer Center Support Programs:   > Cancer Support Group  2nd Tuesday of the month 1pm-2pm, Journey Room

## 2021-01-18 ENCOUNTER — Encounter (HOSPITAL_COMMUNITY): Payer: Self-pay

## 2021-01-23 ENCOUNTER — Encounter (HOSPITAL_COMMUNITY): Payer: Self-pay | Admitting: *Deleted

## 2021-03-30 ENCOUNTER — Telehealth: Payer: Self-pay | Admitting: Family Medicine

## 2021-03-30 ENCOUNTER — Encounter (HOSPITAL_COMMUNITY): Payer: Self-pay

## 2021-03-30 ENCOUNTER — Inpatient Hospital Stay (HOSPITAL_COMMUNITY): Payer: No Typology Code available for payment source | Attending: Hematology

## 2021-03-30 ENCOUNTER — Other Ambulatory Visit (HOSPITAL_COMMUNITY): Payer: Self-pay | Admitting: *Deleted

## 2021-03-30 DIAGNOSIS — D6941 Evans syndrome: Secondary | ICD-10-CM | POA: Insufficient documentation

## 2021-03-30 DIAGNOSIS — E669 Obesity, unspecified: Secondary | ICD-10-CM | POA: Insufficient documentation

## 2021-03-30 DIAGNOSIS — D591 Autoimmune hemolytic anemia, unspecified: Secondary | ICD-10-CM | POA: Insufficient documentation

## 2021-03-30 DIAGNOSIS — D509 Iron deficiency anemia, unspecified: Secondary | ICD-10-CM

## 2021-03-30 DIAGNOSIS — E538 Deficiency of other specified B group vitamins: Secondary | ICD-10-CM | POA: Insufficient documentation

## 2021-03-30 LAB — COMPREHENSIVE METABOLIC PANEL
ALT: 42 U/L (ref 0–44)
AST: 26 U/L (ref 15–41)
Albumin: 4.4 g/dL (ref 3.5–5.0)
Alkaline Phosphatase: 64 U/L (ref 38–126)
Anion gap: 9 (ref 5–15)
BUN: 15 mg/dL (ref 6–20)
CO2: 28 mmol/L (ref 22–32)
Calcium: 9.3 mg/dL (ref 8.9–10.3)
Chloride: 103 mmol/L (ref 98–111)
Creatinine, Ser: 0.82 mg/dL (ref 0.61–1.24)
GFR, Estimated: >60 mL/min (ref >60)
Glucose, Bld: 105 mg/dL — ABNORMAL HIGH (ref 70–99)
Potassium: 3.5 mmol/L (ref 3.5–5.1)
Sodium: 140 mmol/L (ref 135–145)
Total Bilirubin: 0.6 mg/dL (ref 0.3–1.2)
Total Protein: 7.6 g/dL (ref 6.5–8.1)

## 2021-03-30 LAB — CBC WITH DIFFERENTIAL/PLATELET
Abs Immature Granulocytes: 0.03 10*3/uL (ref 0.00–0.07)
Basophils Absolute: 0.1 10*3/uL (ref 0.0–0.1)
Basophils Relative: 1 %
Eosinophils Absolute: 0.1 10*3/uL (ref 0.0–0.5)
Eosinophils Relative: 1 %
HCT: 45.2 % (ref 39.0–52.0)
Hemoglobin: 15.7 g/dL (ref 13.0–17.0)
Immature Granulocytes: 0 %
Lymphocytes Relative: 36 %
Lymphs Abs: 3.1 10*3/uL (ref 0.7–4.0)
MCH: 30.1 pg (ref 26.0–34.0)
MCHC: 34.7 g/dL (ref 30.0–36.0)
MCV: 86.8 fL (ref 80.0–100.0)
Monocytes Absolute: 0.8 10*3/uL (ref 0.1–1.0)
Monocytes Relative: 9 %
Neutro Abs: 4.4 10*3/uL (ref 1.7–7.7)
Neutrophils Relative %: 53 %
Platelets: 210 10*3/uL (ref 150–400)
RBC: 5.21 MIL/uL (ref 4.22–5.81)
RDW: 13.5 % (ref 11.5–15.5)
WBC: 8.5 10*3/uL (ref 4.0–10.5)
nRBC: 0 % (ref 0.0–0.2)

## 2021-03-30 LAB — RETICULOCYTES
Immature Retic Fract: 5.9 % (ref 2.3–15.9)
RBC.: 5.27 MIL/uL (ref 4.22–5.81)
Retic Count, Absolute: 81.7 10*3/uL (ref 19.0–186.0)
Retic Ct Pct: 1.6 % (ref 0.4–3.1)

## 2021-03-30 LAB — LACTATE DEHYDROGENASE: LDH: 167 U/L (ref 98–192)

## 2021-03-30 LAB — IRON AND TIBC
Iron: 74 ug/dL (ref 45–182)
Saturation Ratios: 17 % — ABNORMAL LOW (ref 17.9–39.5)
TIBC: 442 ug/dL (ref 250–450)
UIBC: 368 ug/dL

## 2021-03-30 LAB — VITAMIN B12: Vitamin B-12: 492 pg/mL (ref 180–914)

## 2021-04-01 NOTE — Telephone Encounter (Signed)
I'm not understanding this. His iron levels aren't low according to the lab work completed by oncology last week.

## 2021-04-02 ENCOUNTER — Other Ambulatory Visit (HOSPITAL_COMMUNITY): Payer: Self-pay | Admitting: *Deleted

## 2021-04-02 DIAGNOSIS — D509 Iron deficiency anemia, unspecified: Secondary | ICD-10-CM

## 2021-04-03 ENCOUNTER — Other Ambulatory Visit (HOSPITAL_COMMUNITY): Payer: Self-pay | Admitting: Hematology

## 2021-04-03 ENCOUNTER — Other Ambulatory Visit: Payer: Self-pay

## 2021-04-03 ENCOUNTER — Inpatient Hospital Stay (HOSPITAL_COMMUNITY): Payer: No Typology Code available for payment source

## 2021-04-03 DIAGNOSIS — D591 Autoimmune hemolytic anemia, unspecified: Secondary | ICD-10-CM | POA: Diagnosis not present

## 2021-04-03 DIAGNOSIS — D509 Iron deficiency anemia, unspecified: Secondary | ICD-10-CM

## 2021-04-03 LAB — FERRITIN: Ferritin: 105 ng/mL (ref 24–336)

## 2021-04-03 NOTE — Telephone Encounter (Signed)
Attempted to contact patient - NA °

## 2021-04-04 NOTE — Telephone Encounter (Signed)
Pt returned missed call. Reviewed providers note with pt. Pt said he got everything situated with his oncologist and no longer needs advise about his iron level.

## 2021-04-04 NOTE — Telephone Encounter (Signed)
LMTCB

## 2021-04-04 NOTE — Telephone Encounter (Signed)
Lmtcb  No call back - this encounter will be closed  

## 2021-04-05 ENCOUNTER — Encounter (HOSPITAL_COMMUNITY): Payer: Self-pay

## 2021-04-05 ENCOUNTER — Other Ambulatory Visit: Payer: Self-pay

## 2021-04-05 ENCOUNTER — Inpatient Hospital Stay (HOSPITAL_COMMUNITY): Payer: No Typology Code available for payment source

## 2021-04-05 VITALS — BP 149/84 | HR 78 | Temp 97.7°F | Resp 18

## 2021-04-05 DIAGNOSIS — D591 Autoimmune hemolytic anemia, unspecified: Secondary | ICD-10-CM | POA: Diagnosis not present

## 2021-04-05 DIAGNOSIS — D6941 Evans syndrome: Secondary | ICD-10-CM

## 2021-04-05 DIAGNOSIS — D509 Iron deficiency anemia, unspecified: Secondary | ICD-10-CM

## 2021-04-05 DIAGNOSIS — E538 Deficiency of other specified B group vitamins: Secondary | ICD-10-CM

## 2021-04-05 MED ORDER — SODIUM CHLORIDE 0.9 % IV SOLN: Freq: Once | INTRAVENOUS | Status: AC

## 2021-04-05 MED ORDER — LORATADINE 10 MG PO TABS
10.0000 mg | ORAL_TABLET | Freq: Once | ORAL | Status: AC
  Administered 2021-04-05: 09:00:00 10 mg via ORAL
  Filled 2021-04-05: qty 1

## 2021-04-05 MED ORDER — ACETAMINOPHEN 325 MG PO TABS
650.0000 mg | ORAL_TABLET | Freq: Once | ORAL | Status: AC
  Administered 2021-04-05: 09:00:00 650 mg via ORAL
  Filled 2021-04-05: qty 2

## 2021-04-05 MED ORDER — SODIUM CHLORIDE 0.9 % IV SOLN
400.0000 mg | Freq: Once | INTRAVENOUS | Status: AC
  Administered 2021-04-05: 10:00:00 400 mg via INTRAVENOUS
  Filled 2021-04-05: qty 20

## 2021-04-05 NOTE — Patient Instructions (Signed)
[facility]  Discharge Instructions: Thank you for choosing [facility] to provide your oncology and hematology care.  If you have a lab appointment with the Cancer Center, please come in thru the Main Entrance and check in at the main information desk.  Wear comfortable clothing and clothing appropriate for easy access to any Portacath or PICC line.   We strive to give you quality time with your provider. You may need to reschedule your appointment if you arrive late (15 or more minutes).  Arriving late affects you and other patients whose appointments are after yours.  Also, if you miss three or more appointments without notifying the office, you may be dismissed from the clinic at the provider's discretion.      For prescription refill requests, have your pharmacy contact our office and allow 72 hours for refills to be completed.        To help prevent nausea and vomiting after your treatment, we encourage you to take your nausea medication as directed.  BELOW ARE SYMPTOMS THAT SHOULD BE REPORTED IMMEDIATELY: *FEVER GREATER THAN 100.4 F (38 C) OR HIGHER *CHILLS OR SWEATING *NAUSEA AND VOMITING THAT IS NOT CONTROLLED WITH YOUR NAUSEA MEDICATION *UNUSUAL SHORTNESS OF BREATH *UNUSUAL BRUISING OR BLEEDING *URINARY PROBLEMS (pain or burning when urinating, or frequent urination) *BOWEL PROBLEMS (unusual diarrhea, constipation, pain near the anus) TENDERNESS IN MOUTH AND THROAT WITH OR WITHOUT PRESENCE OF ULCERS (sore throat, sores in mouth, or a toothache) UNUSUAL RASH, SWELLING OR PAIN  UNUSUAL VAGINAL DISCHARGE OR ITCHING   Items with * indicate a potential emergency and should be followed up as soon as possible or go to the Emergency Department if any problems should occur.  Please show the CHEMOTHERAPY ALERT CARD or IMMUNOTHERAPY ALERT CARD at check-in to the Emergency Department and triage nurse.  Should you have questions after your visit or need to cancel  or reschedule your appointment, please contact [facility] [phone]  and follow the prompts.  Office hours are 8:00 a.m. to 4:30 p.m. Monday - Friday. Please note that voicemails left after 4:00 p.m. may not be returned until the following business day.  We are closed weekends and major holidays. You have access to a nurse at all times for urgent questions. Please call the main number to the clinic [phone] and follow the prompts.  For any non-urgent questions, you may also contact your provider using MyChart. We now offer e-Visits for anyone 18 and older to request care online for non-urgent symptoms. For details visit [email or web address].   Also download the MyChart app! Go to the app store, search "MyChart", open the app, select [facility], and log in with your MyChart username and password.  Due to Covid, a mask is required upon entering the hospital/clinic. If you do not have a mask, one will be given to you upon arrival. For doctor visits, patients may have 1 support person aged 18 or older with them. For treatment visits, patients cannot have anyone with them due to current Covid guidelines and our immunocompromised population.  

## 2021-04-05 NOTE — Progress Notes (Signed)
Patient presents today for iron infusion.  Patient is in satisfactory condition with no complaints voiced.  Vital signs are stable.  We will proceed with treatment per MD orders.  Patient tolerated treatment well with no complaints voiced.  Patient left ambulatory in stable condition.  Vital signs stable at discharge.  Follow up as scheduled.    

## 2021-06-14 ENCOUNTER — Other Ambulatory Visit: Payer: Self-pay

## 2021-06-14 ENCOUNTER — Encounter (HOSPITAL_COMMUNITY): Payer: Self-pay | Admitting: Emergency Medicine

## 2021-06-14 ENCOUNTER — Emergency Department (HOSPITAL_COMMUNITY)
Admission: EM | Admit: 2021-06-14 | Discharge: 2021-06-15 | Disposition: A | Discharge status: Home / Self Care | Payer: No Typology Code available for payment source | Attending: Emergency Medicine | Admitting: Emergency Medicine

## 2021-06-14 DIAGNOSIS — R42 Dizziness and giddiness: Secondary | ICD-10-CM | POA: Insufficient documentation

## 2021-06-14 DIAGNOSIS — Z5321 Procedure and treatment not carried out due to patient leaving prior to being seen by health care provider: Secondary | ICD-10-CM | POA: Diagnosis not present

## 2021-06-14 DIAGNOSIS — R202 Paresthesia of skin: Secondary | ICD-10-CM | POA: Insufficient documentation

## 2021-06-14 LAB — CBC WITH DIFFERENTIAL/PLATELET
Abs Immature Granulocytes: 0.08 10*3/uL — ABNORMAL HIGH (ref 0.00–0.07)
Basophils Absolute: 0 10*3/uL (ref 0.0–0.1)
Basophils Relative: 0 %
Eosinophils Absolute: 0 10*3/uL (ref 0.0–0.5)
Eosinophils Relative: 0 %
HCT: 46.3 % (ref 39.0–52.0)
Hemoglobin: 16 g/dL (ref 13.0–17.0)
Immature Granulocytes: 1 %
Lymphocytes Relative: 13 %
Lymphs Abs: 1.6 10*3/uL (ref 0.7–4.0)
MCH: 29.5 pg (ref 26.0–34.0)
MCHC: 34.6 g/dL (ref 30.0–36.0)
MCV: 85.3 fL (ref 80.0–100.0)
Monocytes Absolute: 1.2 10*3/uL — ABNORMAL HIGH (ref 0.1–1.0)
Monocytes Relative: 9 %
Neutro Abs: 9.9 10*3/uL — ABNORMAL HIGH (ref 1.7–7.7)
Neutrophils Relative %: 77 %
Platelets: 278 10*3/uL (ref 150–400)
RBC: 5.43 MIL/uL (ref 4.22–5.81)
RDW: 13.3 % (ref 11.5–15.5)
WBC: 12.8 10*3/uL — ABNORMAL HIGH (ref 4.0–10.5)
nRBC: 0 % (ref 0.0–0.2)

## 2021-06-14 LAB — COMPREHENSIVE METABOLIC PANEL
ALT: 37 U/L (ref 0–44)
AST: 29 U/L (ref 15–41)
Albumin: 4.8 g/dL (ref 3.5–5.0)
Alkaline Phosphatase: 69 U/L (ref 38–126)
Anion gap: 11 (ref 5–15)
BUN: 10 mg/dL (ref 6–20)
CO2: 27 mmol/L (ref 22–32)
Calcium: 9.8 mg/dL (ref 8.9–10.3)
Chloride: 101 mmol/L (ref 98–111)
Creatinine, Ser: 1 mg/dL (ref 0.61–1.24)
GFR, Estimated: >60 mL/min (ref >60)
Glucose, Bld: 114 mg/dL — ABNORMAL HIGH (ref 70–99)
Potassium: 3.8 mmol/L (ref 3.5–5.1)
Sodium: 139 mmol/L (ref 135–145)
Total Bilirubin: 0.9 mg/dL (ref 0.3–1.2)
Total Protein: 7.6 g/dL (ref 6.5–8.1)

## 2021-06-14 NOTE — ED Provider Triage Note (Signed)
Emergency Medicine Provider Triage Evaluation Note ? ?[name] , a 26 y.o. male  was evaluated in triage.  Pt complains of an episode of lightheadedness and dizziness earlier today when he was just sitting.  Felt numbness and tingling in his upper and lower extremities.  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  Interested in getting lab work, as his has not been checked in a while.  No longer has numbness or tingling.  Still mild lightheadedness. ? ?Review of Systems  ?Positive: lightheadedness ?Negative: SOB ? ?Physical Exam  ?BP (!) 155/99 (BP Location: Right Arm)   Pulse 99   Temp 98.9 ?F (37.2 ?C) (Oral)   Resp 16   SpO2 99%  ?Gen:   Awake, no distress   ?Resp:  Normal effort  ?MSK:   Moves extremities without difficulty  ?Other:  No obvious neurodeficits observed ? ?Medical Decision Making  ?Medically screening exam initiated at 8:34 PM.  Appropriate orders placed.  [name] was informed that the remainder of the evaluation will be completed by another provider, this initial triage assessment does not replace that evaluation, and the importance of remaining in the ED until their evaluation is complete. ? ?Labs ordered ?  ?Cecil Cobbs, PA-C ?06/14/21 2036 ? ?

## 2021-06-14 NOTE — ED Triage Notes (Signed)
Pt here via [facility] from PT for sudden onset fatigue and weakness w/ numbness/tingling in extremities 1 hour into physical therapy. Pt has Evans Syndrome and wants to get his platelets and iron levels checked. Pt reports decreased water intake today. 144/882, 98% RA, 98HR, CBG 116 ?

## 2021-06-15 ENCOUNTER — Encounter (HOSPITAL_COMMUNITY): Payer: Self-pay | Admitting: Oncology

## 2021-06-15 ENCOUNTER — Encounter (HOSPITAL_COMMUNITY): Payer: Self-pay

## 2021-06-15 ENCOUNTER — Ambulatory Visit: Payer: No Typology Code available for payment source | Admitting: Family Medicine

## 2021-06-15 ENCOUNTER — Encounter: Payer: Self-pay | Admitting: Family Medicine

## 2021-06-15 VITALS — BP 128/86 | HR 68 | Ht 68.0 in | Wt 233.0 lb

## 2021-06-15 DIAGNOSIS — R202 Paresthesia of skin: Secondary | ICD-10-CM | POA: Diagnosis not present

## 2021-06-15 DIAGNOSIS — R232 Flushing: Secondary | ICD-10-CM

## 2021-06-15 NOTE — Progress Notes (Signed)
Patient called stating that he went to [facility] by ambulance last night and that he sat there for 7 hours and was never seen. Patient wants to know if he should come in and get checked out since his wbc was 12.8 and he was having numbness and tingling and feeling weak. He wants to know if any of his labs shows anything that would cause these symptoms. ? ?Had Rojelio Brenner, PA-C to review labs and made her aware of the symptoms that patient is having. She states that she does not believe this is coming from his Evans Syndrome and that he should be checked out by his PCP or urgent care.  ? ?Patient aware and agreeable with plan to see PCP or urgent care.  ?

## 2021-06-15 NOTE — Progress Notes (Signed)
? ?BP 128/86   Pulse 68   Ht 5\' 8"  (1.727 m)   Wt 233 lb (105.7 kg)   SpO2 100%   BMI 35.43 kg/m?   ? ?Subjective:  ? ?Patient ID: Jeremy Ford, male    DOB: 1996-02-24, 24 y.o.   MRN: [id number] ? ?HPI: ?Jeremy Ford is a 26 y.o. male presenting on 06/15/2021 for Elevated WBC'S and Numbness (And tingling in hands) ? ? ?HPI ?Patient is coming in with an episode of numbness and tingling and feeling flushed and feeling shortness of breath that occurred on 06/14/2021 yesterday.  He was doing PT at work which is more exercise and things that he usually does.  Since this happened yesterday, last about an hour but he has not had any other symptoms with this since yesterday. ? ?Relevant past medical, surgical, family and social history reviewed and updated as indicated. Interim medical history since our last visit reviewed. ?Allergies and medications reviewed and updated. ? ?Review of Systems  ?Constitutional:  Negative for chills and fever.  ?Eyes:  Negative for visual disturbance.  ?Respiratory:  Positive for shortness of breath. Negative for wheezing.   ?Cardiovascular:  Negative for chest pain and leg swelling.  ?Musculoskeletal:  Negative for back pain and gait problem.  ?Skin:  Negative for rash.  ?Neurological:  Positive for dizziness, light-headedness and numbness.  ?All other systems reviewed and are negative. ? ?Per HPI unless specifically indicated above ? ? ?Allergies as of 06/15/2021   ? ?   Reactions  ? Ibuprofen Other (See Comments)  ? States it messes with his platelets  ? Iron   ? Other reaction(s): Constipation  ? ?  ? ?  ?Medication List  ?  ? ?  ? Accurate as of June 15, 2021  4:15 PM. If you have any questions, ask your nurse or doctor.  ?  ?  ? ?  ? ?STOP taking these medications   ? ?VITAMIN B-12 PO ?Stopped by: June 17, 2021, MD ?  ? ?  ? ? ? ?Objective:  ? ?BP 128/86   Pulse 68   Ht 5\' 8"  (1.727 m)   Wt 233 lb (105.7 kg)   SpO2 100%   BMI 35.43 kg/m?   ?Wt Readings from Last 3  Encounters:  ?06/15/21 233 lb (105.7 kg)  ?08/28/20 215 lb (97.5 kg)  ?12/29/19 219 lb 6.4 oz (99.5 kg)  ?  ?Physical Exam ?Vitals and nursing note reviewed.  ?Constitutional:   ?   General: He is not in acute distress. ?   Appearance: He is well-developed. He is not diaphoretic.  ?Eyes:  ?   General: No scleral icterus. ?   Conjunctiva/sclera: Conjunctivae normal.  ?Neck:  ?   Thyroid: No thyromegaly.  ?Cardiovascular:  ?   Rate and Rhythm: Normal rate and regular rhythm.  ?   Heart sounds: Normal heart sounds. No murmur heard. ?Pulmonary:  ?   Effort: Pulmonary effort is normal. No respiratory distress.  ?   Breath sounds: Normal breath sounds. No wheezing.  ?Musculoskeletal:     ?   General: Normal range of motion.  ?   Cervical back: Neck supple.  ?Lymphadenopathy:  ?   Cervical: No cervical adenopathy.  ?Skin: ?   General: Skin is warm and dry.  ?   Findings: No rash.  ?Neurological:  ?   Mental Status: He is alert and oriented to person, place, and time.  ?   Coordination: Coordination normal.  ?  Psychiatric:     ?   Behavior: Behavior normal.  ? ? ? ? ?Assessment & Plan:  ? ?Problem List Items Addressed This Visit   ?None ?Visit Diagnoses   ? ? Flushing    -  Primary  ? Relevant Orders  ? Anemia Profile B  ? Tingling      ? Relevant Orders  ? Anemia Profile B  ? ?  ?  ?Likely flushing due to exercise, will take it easy for the immediate future.  We will monitor closely but if happens again then we may need to go see cardiology.  We will recheck his blood work counts. ?Follow up plan: ?Return if symptoms worsen or fail to improve. ? ?Counseling provided for all of the vaccine components ?Orders Placed This Encounter  ?Procedures  ? Anemia Profile B  ? ? ?Arville Care, MD ?[facility] ?06/15/2021, 4:15 PM ? ? ?  ?

## 2021-06-15 NOTE — ED Notes (Signed)
Patient states he is going to [employer] in the  morning ?

## 2021-06-16 LAB — ANEMIA PROFILE B
Basophils Absolute: 0.1 10*3/uL (ref 0.0–0.2)
Basos: 1 %
EOS (ABSOLUTE): 0.1 10*3/uL (ref 0.0–0.4)
Eos: 2 %
Ferritin: 330 ng/mL (ref 30–400)
Folate: 9.4 ng/mL (ref >3.0)
Hematocrit: 42.8 % (ref 37.5–51.0)
Hemoglobin: 15.2 g/dL (ref 13.0–17.7)
Immature Grans (Abs): 0 10*3/uL (ref 0.0–0.1)
Immature Granulocytes: 0 %
Iron Saturation: 20 % (ref 15–55)
Iron: 70 ug/dL (ref 38–169)
Lymphocytes Absolute: 2.4 10*3/uL (ref 0.7–3.1)
Lymphs: 36 %
MCH: 29.8 pg (ref 26.6–33.0)
MCHC: 35.5 g/dL (ref 31.5–35.7)
MCV: 84 fL (ref 79–97)
Monocytes Absolute: 0.9 10*3/uL (ref 0.1–0.9)
Monocytes: 13 %
Neutrophils Absolute: 3.2 10*3/uL (ref 1.4–7.0)
Neutrophils: 48 %
Platelets: 263 10*3/uL (ref 150–450)
RBC: 5.1 x10E6/uL (ref 4.14–5.80)
RDW: 13.3 % (ref 11.6–15.4)
Retic Ct Pct: 1.5 % (ref 0.6–2.6)
Total Iron Binding Capacity: 356 ug/dL (ref 250–450)
UIBC: 286 ug/dL (ref 111–343)
Vitamin B-12: 465 pg/mL (ref 232–1245)
WBC: 6.5 10*3/uL (ref 3.4–10.8)

## 2021-06-25 ENCOUNTER — Ambulatory Visit: Payer: No Typology Code available for payment source | Admitting: Family Medicine

## 2021-06-26 ENCOUNTER — Encounter (HOSPITAL_COMMUNITY): Payer: Self-pay | Admitting: Oncology

## 2021-08-07 ENCOUNTER — Encounter: Payer: Self-pay | Admitting: Family Medicine

## 2021-08-07 ENCOUNTER — Encounter (HOSPITAL_COMMUNITY): Payer: Self-pay | Admitting: Oncology

## 2021-08-07 ENCOUNTER — Ambulatory Visit (INDEPENDENT_AMBULATORY_CARE_PROVIDER_SITE_OTHER): Payer: No Typology Code available for payment source | Admitting: Family Medicine

## 2021-08-07 VITALS — BP 142/94 | HR 85 | Temp 97.9°F | Ht 68.0 in | Wt 236.8 lb

## 2021-08-07 DIAGNOSIS — E669 Obesity, unspecified: Secondary | ICD-10-CM

## 2021-08-07 DIAGNOSIS — G4719 Other hypersomnia: Secondary | ICD-10-CM | POA: Diagnosis not present

## 2021-08-07 DIAGNOSIS — R0683 Snoring: Secondary | ICD-10-CM | POA: Diagnosis not present

## 2021-08-07 DIAGNOSIS — R5383 Other fatigue: Secondary | ICD-10-CM | POA: Diagnosis not present

## 2021-08-07 DIAGNOSIS — R030 Elevated blood-pressure reading, without diagnosis of hypertension: Secondary | ICD-10-CM

## 2021-08-07 DIAGNOSIS — R7309 Other abnormal glucose: Secondary | ICD-10-CM

## 2021-08-07 LAB — BAYER DCA HB A1C WAIVED: HB A1C (BAYER DCA - WAIVED): 5.1 % (ref 4.8–5.6)

## 2021-08-07 NOTE — Progress Notes (Signed)
? ?Assessment & Plan:  ?1. Elevated glucose ?- Bayer DCA Hb A1c Waived ? ?2. Fatigue, unspecified type ?- VITAMIN D 25 Hydroxy (Vit-D Deficiency, Fractures) ?- Testosterone,Free and Total ? ?3-4. Snoring/Excessive daytime sleepiness ?Home sleep study ordered. ? ?5. Obesity (BMI 30-39.9) ?Encouraged healthy eating and exercise. ? ?6. Elevated BP without diagnosis of hypertension ?Patient to monitor blood pressure at home, keep a log, and bring it back with him to his next appointment. Discussed if BP remains elevated he will need a medication to treat.  ? ? ?Return in about 4 weeks (around 09/04/2021) for BP. ? ?Deliah Boston, MSN, APRN, FNP-C ?[facility] ? ?Subjective:  ? ? Patient ID: Jeremy Ford, male    DOB: 1995/09/25, 26 y.o.   MRN: [id number] ? ?Patient Care Team: ?Gwenlyn Fudge, FNP as PCP - General (Family Medicine)  ? ?Chief Complaint:  ?Chief Complaint  ?Patient presents with  ? Labs Only  ?  Patient states that his BS was 168 and he would like to be checked for DM.   ? Fatigue  ?  X 1 month   ? ? ?HPI: ?Jeremy Ford is a 26 y.o. male presenting on 08/07/2021 for Labs Only (Patient states that his BS was 168 and he would like to be checked for DM. ) and Fatigue (X 1 month ) ? ?Patient is accompanied by his mother who he is okay with being present. ? ?Patient would like to be checked for diabetes as his fasting glucose the other morning was 168. He is also staying thirsty. He does have a family history of diabetes. ? ?He also reports he is staying tired all the time.  ? ?Height: 5\' 8"  (1.727 m)  Weight: 236 lbs ?BMI: Body mass index is 36.01 kg/m?. ? ?Neck circumference: 42 cm ? ?Do you snore loudly (louder than talking or loud enough to be heard through closed doors)? ? Yes ?Do you often feel tired, fatigued, or sleepy during daytime? ? Yes ?Has anyone observed you stop breathing during your sleep? ? Yes ?Do you have or are you being treated for high blood  pressure? ? No ?BMI more than 35 kg/m2? ? Yes ?Age over 50 years? ? No ?Neck circumference greater than 40 cm? ? Yes ?Male gender? ? Yes ? ?Total "Yes" Answers: 6 just enough to start breathing again and say what the next morning you do not even know that have been ? ? ?Social history: ? ?Relevant past medical, surgical, family and social history reviewed and updated as indicated. Interim medical history since our last visit reviewed. ? ?Allergies and medications reviewed and updated. ? ?DATA REVIEWED: CHART IN EPIC ? ?ROS: Negative unless specifically indicated above in HPI.  ? ? ?Current Outpatient Medications:  ?  vitamin B-12 (CYANOCOBALAMIN) 100 MCG tablet, Take 100 mcg by mouth daily., Disp: , Rfl:   ? ?Allergies  ?Allergen Reactions  ? Ibuprofen Other (See Comments)  ?  States it messes with his platelets  ? Iron   ?  Other reaction(s): Constipation  ? ?Past Medical History:  ?Diagnosis Date  ? 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 ADHD (attention deficit hyperactivity disorder)   ? 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HCC)   ? Hyperlipidemia 12/31/2019  ? Iron deficiency anemia 03/18/2016  ?  ?Past Surgical History:  ?Procedure Laterality Date  ? ADENOIDECTOMY    ? TONSILLECTOMY    ? TYMPANOSTOMY TUBE PLACEMENT    ?  ?Social History  ? ?Socioeconomic History  ?  Marital status: Single  ?  Spouse name: Not on file  ? Number of children: Not on file  ? Years of education: Not on file  ? Highest education level: Not on file  ?Occupational History  ? Not on file  ?Tobacco Use  ? Smoking status: Never  ? Smokeless tobacco: Never  ?Vaping Use  ? Vaping Use: Never used  ?Substance and Sexual Activity  ? Alcohol use: No  ? Drug use: No  ? Sexual activity: Never  ?Other Topics Concern  ? Not on file  ?Social History Narrative  ? Lives with Mother and brother ; No pets in the house; No smokers in the house.  ? ?Social Determinants of Health  ? ?Financial Resource Strain: Not on file  ?Food Insecurity: Not on file  ?Transportation Needs: Not on file   ?Physical Activity: Not on file  ?Stress: Not on file  ?Social Connections: Not on file  ?Intimate Partner Violence: Not on file  ?  ? ?   ?Objective:  ?  ?BP (!) 142/94   Pulse 85   Temp 97.9 ?F (36.6 ?C) (Temporal)   Ht 5\' 8"  (1.727 m)   Wt 236 lb 12.8 oz (107.4 kg)   SpO2 98%   BMI 36.01 kg/m?  ? ?Wt Readings from Last 3 Encounters:  ?08/07/21 236 lb 12.8 oz (107.4 kg)  ?06/15/21 233 lb (105.7 kg)  ?08/28/20 215 lb (97.5 kg)  ? ? ?Physical Exam ?Vitals reviewed.  ?Constitutional:   ?   General: He is not in acute distress. ?   Appearance: Normal appearance. He is obese. He is not ill-appearing, toxic-appearing or diaphoretic.  ?HENT:  ?   Head: Normocephalic and atraumatic.  ?Eyes:  ?   General: No scleral icterus.    ?   Right eye: No discharge.     ?   Left eye: No discharge.  ?   Conjunctiva/sclera: Conjunctivae normal.  ?Cardiovascular:  ?   Rate and Rhythm: Normal rate and regular rhythm.  ?   Heart sounds: Normal heart sounds. No murmur heard. ?  No friction rub. No gallop.  ?Pulmonary:  ?   Effort: Pulmonary effort is normal. No respiratory distress.  ?   Breath sounds: Normal breath sounds. No stridor. No wheezing, rhonchi or rales.  ?Musculoskeletal:     ?   General: Normal range of motion.  ?   Cervical back: Normal range of motion.  ?Skin: ?   General: Skin is warm and dry.  ?Neurological:  ?   Mental Status: He is alert and oriented to person, place, and time. Mental status is at baseline.  ?Psychiatric:     ?   Mood and Affect: Mood normal.     ?   Behavior: Behavior normal.     ?   Thought Content: Thought content normal.     ?   Judgment: Judgment normal.  ? ? ?Lab Results  ?Component Value Date  ? TSH 0.580 12/29/2019  ? ?Lab Results  ?Component Value Date  ? WBC 6.5 06/15/2021  ? HGB 15.2 06/15/2021  ? HCT 42.8 06/15/2021  ? MCV 84 06/15/2021  ? PLT 263 06/15/2021  ? ?Lab Results  ?Component Value Date  ? NA 139 06/14/2021  ? K 3.8 06/14/2021  ? CO2 27 06/14/2021  ? GLUCOSE 114 (H)  06/14/2021  ? BUN 10 06/14/2021  ? CREATININE 1.00 06/14/2021  ? BILITOT 0.9 06/14/2021  ? ALKPHOS 69 06/14/2021  ? AST 29 06/14/2021  ?  ALT 37 06/14/2021  ? PROT 7.6 06/14/2021  ? ALBUMIN 4.8 06/14/2021  ? CALCIUM 9.8 06/14/2021  ? ANIONGAP 11 06/14/2021  ? ?Lab Results  ?Component Value Date  ? CHOL 213 (H) 12/29/2019  ? ?Lab Results  ?Component Value Date  ? HDL 40 12/29/2019  ? ?Lab Results  ?Component Value Date  ? LDLCALC 141 (H) 12/29/2019  ? ?Lab Results  ?Component Value Date  ? TRIG 176 (H) 12/29/2019  ? ?Lab Results  ?Component Value Date  ? CHOLHDL 5.3 (H) 12/29/2019  ? ?No results found for: HGBA1C ? ?   ? ? ? ? ?

## 2021-08-08 ENCOUNTER — Telehealth: Payer: Self-pay | Admitting: Family Medicine

## 2021-08-08 ENCOUNTER — Other Ambulatory Visit (HOSPITAL_COMMUNITY): Payer: Self-pay | Admitting: *Deleted

## 2021-08-08 DIAGNOSIS — E538 Deficiency of other specified B group vitamins: Secondary | ICD-10-CM

## 2021-08-08 DIAGNOSIS — D6941 Evans syndrome: Secondary | ICD-10-CM

## 2021-08-08 DIAGNOSIS — D509 Iron deficiency anemia, unspecified: Secondary | ICD-10-CM

## 2021-08-08 DIAGNOSIS — E559 Vitamin D deficiency, unspecified: Secondary | ICD-10-CM

## 2021-08-08 HISTORY — DX: Vitamin D deficiency, unspecified: E55.9

## 2021-08-08 LAB — SPECIMEN STATUS

## 2021-08-08 LAB — VITAMIN D 25 HYDROXY (VIT D DEFICIENCY, FRACTURES): Vit D, 25-Hydroxy: 20.7 ng/mL — ABNORMAL LOW (ref 30.0–100.0)

## 2021-08-08 LAB — TESTOSTERONE,FREE AND TOTAL
Testosterone, Free: 11.9 pg/mL (ref 9.3–26.5)
Testosterone: 305 ng/dL (ref 264–916)

## 2021-08-08 NOTE — Telephone Encounter (Signed)
I typically wait until all lab work results before sending messages on MyChart.  However he can go ahead and start an over-the-counter vitamin D3 5,000 units once daily. ?

## 2021-08-08 NOTE — Telephone Encounter (Signed)
Pt saw his lab results via My Chart and wants to know if PCP is going to call him in a Rx for his low Vitamin D level? ?

## 2021-08-08 NOTE — Telephone Encounter (Signed)
Patient would like to speak with nurse about recommendations for vitamin D. Please call back.  ?

## 2021-08-08 NOTE — Telephone Encounter (Signed)
Patient informed of recommendations  

## 2021-08-08 NOTE — Telephone Encounter (Signed)
Left message making pt aware of Britney's recommendations and to call back with any concerns. ?

## 2021-08-09 ENCOUNTER — Encounter: Payer: Self-pay | Admitting: Family Medicine

## 2021-08-28 LAB — LIPID PANEL
Cholesterol: 248 — AB (ref 0–200)
HDL: 42 (ref 35–70)
LDL Cholesterol: 190
Triglycerides: 89 (ref 40–160)

## 2021-08-30 ENCOUNTER — Inpatient Hospital Stay (HOSPITAL_COMMUNITY): Payer: No Typology Code available for payment source

## 2021-08-31 ENCOUNTER — Inpatient Hospital Stay (HOSPITAL_COMMUNITY): Payer: Commercial Managed Care - PPO | Attending: Hematology

## 2021-08-31 DIAGNOSIS — D591 Autoimmune hemolytic anemia, unspecified: Secondary | ICD-10-CM | POA: Insufficient documentation

## 2021-08-31 DIAGNOSIS — E538 Deficiency of other specified B group vitamins: Secondary | ICD-10-CM | POA: Insufficient documentation

## 2021-08-31 DIAGNOSIS — D509 Iron deficiency anemia, unspecified: Secondary | ICD-10-CM

## 2021-08-31 DIAGNOSIS — R5383 Other fatigue: Secondary | ICD-10-CM | POA: Insufficient documentation

## 2021-08-31 DIAGNOSIS — D6941 Evans syndrome: Secondary | ICD-10-CM | POA: Insufficient documentation

## 2021-08-31 DIAGNOSIS — E669 Obesity, unspecified: Secondary | ICD-10-CM | POA: Diagnosis not present

## 2021-08-31 LAB — CBC WITH DIFFERENTIAL/PLATELET
Abs Immature Granulocytes: 0.02 10*3/uL (ref 0.00–0.07)
Basophils Absolute: 0 10*3/uL (ref 0.0–0.1)
Basophils Relative: 1 %
Eosinophils Absolute: 0.1 10*3/uL (ref 0.0–0.5)
Eosinophils Relative: 1 %
HCT: 44.6 % (ref 39.0–52.0)
Hemoglobin: 15.3 g/dL (ref 13.0–17.0)
Immature Granulocytes: 0 %
Lymphocytes Relative: 40 %
Lymphs Abs: 3.1 10*3/uL (ref 0.7–4.0)
MCH: 29 pg (ref 26.0–34.0)
MCHC: 34.3 g/dL (ref 30.0–36.0)
MCV: 84.6 fL (ref 80.0–100.0)
Monocytes Absolute: 0.9 10*3/uL (ref 0.1–1.0)
Monocytes Relative: 12 %
Neutro Abs: 3.6 10*3/uL (ref 1.7–7.7)
Neutrophils Relative %: 46 %
Platelets: 251 10*3/uL (ref 150–400)
RBC: 5.27 MIL/uL (ref 4.22–5.81)
RDW: 13.3 % (ref 11.5–15.5)
WBC: 7.7 10*3/uL (ref 4.0–10.5)
nRBC: 0 % (ref 0.0–0.2)

## 2021-08-31 LAB — IRON AND TIBC
Iron: 75 ug/dL (ref 45–182)
Saturation Ratios: 18 % (ref 17.9–39.5)
TIBC: 426 ug/dL (ref 250–450)
UIBC: 351 ug/dL

## 2021-08-31 LAB — LACTATE DEHYDROGENASE: LDH: 165 U/L (ref 98–192)

## 2021-08-31 LAB — FERRITIN: Ferritin: 151 ng/mL (ref 24–336)

## 2021-08-31 LAB — VITAMIN B12: Vitamin B-12: 605 pg/mL (ref 180–914)

## 2021-09-04 ENCOUNTER — Ambulatory Visit: Payer: No Typology Code available for payment source | Admitting: Family Medicine

## 2021-09-04 ENCOUNTER — Encounter: Payer: Self-pay | Admitting: Family Medicine

## 2021-09-04 ENCOUNTER — Inpatient Hospital Stay (HOSPITAL_COMMUNITY): Payer: Commercial Managed Care - PPO | Admitting: Hematology

## 2021-09-04 ENCOUNTER — Telehealth: Payer: Self-pay | Admitting: Family Medicine

## 2021-09-04 VITALS — BP 147/96 | HR 92 | Temp 98.1°F | Resp 16 | Ht 69.0 in | Wt 232.9 lb

## 2021-09-04 DIAGNOSIS — D6941 Evans syndrome: Secondary | ICD-10-CM | POA: Diagnosis not present

## 2021-09-04 DIAGNOSIS — D509 Iron deficiency anemia, unspecified: Secondary | ICD-10-CM | POA: Diagnosis not present

## 2021-09-04 DIAGNOSIS — D591 Autoimmune hemolytic anemia, unspecified: Secondary | ICD-10-CM | POA: Diagnosis not present

## 2021-09-04 NOTE — Telephone Encounter (Signed)
Patient has appt

## 2021-09-04 NOTE — Patient Instructions (Addendum)
[facility] at [facility] Discharge Instructions   You were seen and examined today by Dr. Ellin Saba.  He reviewed the results of your lab work which is normal/stable.   Your cholesterol was elevated. Talk to [place], NP about diet changes and exercises to control this.   Continue taking Vitamin D and Vitamin B12.   Return as scheduled.    Thank you for choosing [facility] at [facility] to provide your oncology and hematology care.  To afford each patient quality time with our provider, please arrive at least 15 minutes before your scheduled appointment time.   If you have a lab appointment with the Cancer Center please come in thru the Main Entrance and check in at the main information desk.  You need to re-schedule your appointment should you arrive 10 or more minutes late.  We strive to give you quality time with our providers, and arriving late affects you and other patients whose appointments are after yours.  Also, if you no show three or more times for appointments you may be dismissed from the clinic at the providers discretion.     Again, thank you for choosing [facility].  Our hope is that these requests will decrease the amount of time that you wait before being seen by our physicians.       _____________________________________________________________  Should you have questions after your visit to [facility], please contact our office at [phone] and follow the prompts.  Our office hours are 8:00 a.m. and 4:30 p.m. Monday - Friday.  Please note that voicemails left after 4:00 p.m. may not be returned until the following business day.  We are closed weekends and major holidays.  You do have access to a nurse 24-7, just call the main number to the clinic [phone] and do not press any options, hold on the line and a nurse will answer the phone.    For prescription refill requests, have your  pharmacy contact our office and allow 72 hours.    Due to Covid, you will need to wear a mask upon entering the hospital. If you do not have a mask, a mask will be given to you at the Main Entrance upon arrival. For doctor visits, patients may have 1 support person age 36 or older with them. For treatment visits, patients can not have anyone with them due to social distancing guidelines and our immunocompromised population.

## 2021-09-04 NOTE — Progress Notes (Signed)
[facility] [address], [place] [address]   CLINIC:  Medical Oncology/Hematology  PCP:  Gwenlyn Fudge, FNP [address] [place] [address]  [phone]  REASON FOR VISIT:  Follow-up for Jeremy Ford and IDA  PRIOR THERAPY: Rituxan therapy, last dose 04/11/16  CURRENT THERAPY: surveillance  INTERVAL HISTORY:  Jeremy Ford, a 26 y.o. male, returns for routine follow-up for his Jeremy Ford and IDA. Jeremy Ford was last seen on 08/28/2020.  Today he reports feeling well. He denies bleeding issues and night sweats. He reports 1 episode of numbness in his hands. He denies infections in the past year. He reports fatigue. He is taking 1000 units vitamin D OTC daily.   REVIEW OF SYSTEMS:  Review of Systems  Constitutional:  Positive for fatigue. Negative for appetite change.  HENT:   Negative for nosebleeds.   Respiratory:  Negative for hemoptysis.   Gastrointestinal:  Negative for blood in stool.  Endocrine: Negative for hot flashes.  Genitourinary:  Negative for hematuria.   Neurological:  Negative for numbness (x1).  Hematological:  Does not bruise/bleed easily.  Psychiatric/Behavioral:  Positive for sleep disturbance.   All other systems reviewed and are negative.  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   ADHD (attention deficit hyperactivity disorder)    Jeremy Ford (HCC)    Hyperlipidemia 12/31/2019   Iron deficiency anemia 03/18/2016   Vitamin D insufficiency 08/08/2021   Past Surgical History:  Procedure Laterality Date   ADENOIDECTOMY     TONSILLECTOMY     TYMPANOSTOMY TUBE PLACEMENT      SOCIAL HISTORY:  Social History   Socioeconomic History   Marital status: Single    Spouse name: Not on file   Number of children: Not on file   Years of education: Not on file   Highest education level: Not on file  Occupational History   Not on file   Tobacco Use   Smoking status: Never   Smokeless tobacco: Never  Vaping Use   Vaping Use: Never used  Substance and Sexual Activity   Alcohol use: No   Drug use: No   Sexual activity: Never  Other Topics Concern   Not on file  Social History Narrative   Lives with Mother and brother ; No pets in the house; No smokers in the house.   Social Determinants of Health   Financial Resource Strain: Not on file  Food Insecurity: Not on file  Transportation Needs: Not on file  Physical Activity: Not on file  Stress: Not on file  Social Connections: Not on file  Intimate Partner Violence: Not on file    FAMILY HISTORY:  Family History  Problem Relation Age of Onset   Breast cancer Maternal Grandmother    Diabetes Maternal Grandfather    Hypertension Maternal Grandfather    Diabetes Paternal Grandmother    Asthma Paternal Grandmother    Hypertension Paternal Grandmother    Asthma Mother    Diabetes Mother    Hypertension Mother    Skin cancer Father    Colon polyps Father    Colon cancer Maternal Aunt    Lung cancer Paternal Uncle    Diabetes Paternal Grandfather    Hypertension Paternal Grandfather    Hyperlipidemia Paternal Grandfather    Skin cancer Paternal Grandfather    Bladder Cancer Other    Throat cancer Other     CURRENT MEDICATIONS:  Current Outpatient Medications  Medication Sig Dispense Refill   vitamin B-12 (CYANOCOBALAMIN) 100 MCG tablet Take 100 mcg by mouth daily.     No current facility-administered medications for this visit.    ALLERGIES:  Allergies  Allergen Reactions   Ibuprofen Other (See Comments)    States it messes with his platelets   Iron     Other reaction(s): Constipation    PHYSICAL EXAM:  Performance status (ECOG): 0 - Asymptomatic  There were no vitals filed for this visit. Wt Readings from Last 3 Encounters:  08/07/21 236 lb 12.8 oz (107.4 kg)  06/15/21 233 lb (105.7 kg)  08/28/20 215 lb (97.5 kg)   Physical  Exam Vitals reviewed.  Constitutional:      Appearance: Normal appearance. He is obese.  Cardiovascular:     Rate and Rhythm: Normal rate and regular rhythm.     Pulses: Normal pulses.     Heart sounds: Normal heart sounds.  Pulmonary:     Effort: Pulmonary effort is normal.     Breath sounds: Normal breath sounds.  Abdominal:     Palpations: Abdomen is soft. There is no hepatomegaly, splenomegaly or mass.     Tenderness: There is no abdominal tenderness.  Lymphadenopathy:     Upper Body:     Right upper body: No supraclavicular or axillary adenopathy.     Left upper body: No supraclavicular or axillary adenopathy.     Lower Body: No right inguinal adenopathy. No left inguinal adenopathy.  Neurological:     General: No focal deficit present.     Mental Status: He is alert and oriented to person, place, and time.  Psychiatric:        Mood and Affect: Mood normal.        Behavior: Behavior normal.    LABORATORY DATA:  I have reviewed the labs as listed.     Latest Ref Rng & Units 08/31/2021    7:24 AM 08/07/2021    9:03 AM 06/15/2021    4:20 PM  CBC  WBC 4.0 - 10.5 K/uL 7.7   WILL FOLLOW  P 6.5    Hemoglobin 13.0 - 17.0 g/dL 16.1   WILL FOLLOW  P 09.6    Hematocrit 39.0 - 52.0 % 44.6   WILL FOLLOW  P 42.8    Platelets 150 - 400 K/uL 251   WILL FOLLOW  P 263      P Preliminary result      Latest Ref Rng & Units 06/14/2021    8:44 PM 03/30/2021   10:57 AM 08/11/2019    1:43 PM  CMP  Glucose 70 - 99 mg/dL 045   409   76    BUN 6 - 20 mg/dL 10   15   12     Creatinine 0.61 - 1.24 mg/dL   8.11   9.14    Sodium 135 - 145 mmol/L 139   140   143    Potassium 3.5 - 5.1 mmol/L 3.8   3.5   4.1    Chloride 98 - 111 mmol/L 101   103   101    CO2 22 - 32 mmol/L 27   28   27     Calcium 8.9 - 10.3 mg/dL 9.8   9.3   9.6    Total Protein 6.5 - 8.1 g/dL 7.6   7.6   7.4    Total Bilirubin 0.3 - 1.2 mg/dL 0.9   0.6   0.5  Alkaline Phos 38 - 126 U/L 69   64   91    AST 15 - 41 U/L  29   26   20     ALT 0 - 44 U/L 37   42   22        Component Value Date/Time   RBC 5.27 08/31/2021 0724   MCV 84.6 08/31/2021 0724   MCV WILL FOLLOW 08/07/2021 0903   MCH 29.0 08/31/2021 0724   MCHC 34.3 08/31/2021 0724   RDW 13.3 08/31/2021 0724   RDW WILL FOLLOW 08/07/2021 0903   LYMPHSABS 3.1 08/31/2021 0724   LYMPHSABS WILL FOLLOW 08/07/2021 0903   MONOABS 0.9 08/31/2021 0724   EOSABS 0.1 08/31/2021 0724   EOSABS WILL FOLLOW 08/07/2021 0903   BASOSABS 0.0 08/31/2021 0724   BASOSABS WILL FOLLOW 08/07/2021 0903    DIAGNOSTIC IMAGING:  I have independently reviewed the scans and discussed with the patient. No results found.   ASSESSMENT:  1.  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  Initially diagnosed on 02/15/2016 when he [facility] with epistaxis and mucosal hematomas with a platelet count of < 10,000.  He responded to IVIG with platelet count reaching > 100,000 within 72 hours of treatment, BUT 11 days after treatment, he again relapsed with a platelet count < 10,000 without evidence of bleeding.  He was re-admitted and given IVIG again with ANOTHER TRANSIENT response.  After an initial drop in HGB from 13 to 8 g/dL during his initial hospitalization, his HGB has been between 9-10 g/dL without transfusion.  DAT positive for IgG (PRIOR to IVIG).  Markers for hemolysis have been consistently negative. Completed Rituxan therapy; last dose 04/11/16.    PLAN:  1. Jeremy Ford: - He does not report any bruising or easy bleeding. - He is working 12-hour shifts at [employer]. - Reviewed CBC which shows normal platelet count and hemoglobin. - RTC 1 year for follow-up with repeat labs.   2.  Low B12 levels: - Continue B12 1 mg tablet daily.  B12 level is normal.  3.  Fatigue: - He reports tiredness.  Iron infusion has helped with tiredness last time. -  Ferritin is 151 and percent saturation 18.  Hemoglobin is 15.3. - Recommend 1 infusion of Feraheme or Venofer 400 mg.  Orders placed this encounter:  No orders of the defined types were placed in this encounter.    Doreatha MassedSreedhar Harlem Thresher, MD [facility] [phone]   I, Alda PonderKirstyn Evans, am acting as a scribe for Dr. Doreatha MassedSreedhar Ollivander See.  I, Doreatha MassedSreedhar Elyshia Kumagai MD, have reviewed the above documentation for accuracy and completeness, and I agree with the above.

## 2021-09-05 ENCOUNTER — Inpatient Hospital Stay (HOSPITAL_COMMUNITY): Payer: Commercial Managed Care - PPO

## 2021-09-05 VITALS — BP 132/83 | HR 61 | Temp 97.7°F | Resp 18

## 2021-09-05 DIAGNOSIS — D508 Other iron deficiency anemias: Secondary | ICD-10-CM

## 2021-09-05 DIAGNOSIS — D591 Autoimmune hemolytic anemia, unspecified: Secondary | ICD-10-CM | POA: Diagnosis not present

## 2021-09-05 DIAGNOSIS — D509 Iron deficiency anemia, unspecified: Secondary | ICD-10-CM | POA: Insufficient documentation

## 2021-09-05 DIAGNOSIS — E538 Deficiency of other specified B group vitamins: Secondary | ICD-10-CM

## 2021-09-05 MED ORDER — SODIUM CHLORIDE 0.9 % IV SOLN: Freq: Once | INTRAVENOUS | Status: AC

## 2021-09-05 MED ORDER — EPINEPHRINE 0.3 MG/0.3ML IJ SOAJ: 0.3000 mg | Freq: Once | INTRAMUSCULAR | Status: DC | PRN

## 2021-09-05 MED ORDER — DIPHENHYDRAMINE HCL 50 MG/ML IJ SOLN: 50.0000 mg | Freq: Once | INTRAMUSCULAR | Status: DC | PRN

## 2021-09-05 MED ORDER — SODIUM CHLORIDE 0.9 % IV SOLN
400.0000 mg | Freq: Once | INTRAVENOUS | Status: AC
  Administered 2021-09-05: 400 mg via INTRAVENOUS
  Filled 2021-09-05: qty 10

## 2021-09-05 MED ORDER — ALBUTEROL SULFATE (2.5 MG/3ML) 0.083% IN NEBU: 2.5000 mg | INHALATION_SOLUTION | Freq: Once | RESPIRATORY_TRACT | Status: DC | PRN

## 2021-09-05 MED ORDER — SODIUM CHLORIDE 0.9 % IV SOLN: Freq: Once | INTRAVENOUS | Status: DC | PRN

## 2021-09-05 MED ORDER — IRON SUCROSE 20 MG/ML IV SOLN: 400.0000 mg | Freq: Once | INTRAVENOUS | Status: DC

## 2021-09-05 MED ORDER — METHYLPREDNISOLONE SODIUM SUCC 125 MG IJ SOLR: 125.0000 mg | Freq: Once | INTRAMUSCULAR | Status: DC | PRN

## 2021-09-05 MED ORDER — LORATADINE 10 MG PO TABS: 10.0000 mg | ORAL_TABLET | Freq: Once | ORAL | Status: DC

## 2021-09-05 MED ORDER — FAMOTIDINE IN NACL 20-0.9 MG/50ML-% IV SOLN: 20.0000 mg | Freq: Once | INTRAVENOUS | Status: DC | PRN

## 2021-09-05 MED ORDER — ACETAMINOPHEN 325 MG PO TABS: 650.0000 mg | ORAL_TABLET | Freq: Once | ORAL | Status: DC

## 2021-09-05 NOTE — Patient Instructions (Signed)
[facility]  Discharge Instructions: Thank you for choosing [facility] to provide your oncology and hematology care.  If you have a lab appointment with the Cancer Center, please come in thru the Main Entrance and check in at the main information desk.  Wear comfortable clothing and clothing appropriate for easy access to any Portacath or PICC line.   We strive to give you quality time with your provider. You may need to reschedule your appointment if you arrive late (15 or more minutes).  Arriving late affects you and other patients whose appointments are after yours.  Also, if you miss three or more appointments without notifying the office, you may be dismissed from the clinic at the provider's discretion.      For prescription refill requests, have your pharmacy contact our office and allow 72 hours for refills to be completed.    Today you received the following chemotherapy and/or immunotherapy agents Venofer      To help prevent nausea and vomiting after your treatment, we encourage you to take your nausea medication as directed.  BELOW ARE SYMPTOMS THAT SHOULD BE REPORTED IMMEDIATELY: *FEVER GREATER THAN 100.4 F (38 C) OR HIGHER *CHILLS OR SWEATING *NAUSEA AND VOMITING THAT IS NOT CONTROLLED WITH YOUR NAUSEA MEDICATION *UNUSUAL SHORTNESS OF BREATH *UNUSUAL BRUISING OR BLEEDING *URINARY PROBLEMS (pain or burning when urinating, or frequent urination) *BOWEL PROBLEMS (unusual diarrhea, constipation, pain near the anus) TENDERNESS IN MOUTH AND THROAT WITH OR WITHOUT PRESENCE OF ULCERS (sore throat, sores in mouth, or a toothache) UNUSUAL RASH, SWELLING OR PAIN  UNUSUAL VAGINAL DISCHARGE OR ITCHING   Items with * indicate a potential emergency and should be followed up as soon as possible or go to the Emergency Department if any problems should occur.  Please show the CHEMOTHERAPY ALERT CARD or IMMUNOTHERAPY ALERT CARD at check-in to the Emergency  Department and triage nurse.  Should you have questions after your visit or need to cancel or reschedule your appointment, please contact [facility] [phone]  and follow the prompts.  Office hours are 8:00 a.m. to 4:30 p.m. Monday - Friday. Please note that voicemails left after 4:00 p.m. may not be returned until the following business day.  We are closed weekends and major holidays. You have access to a nurse at all times for urgent questions. Please call the main number to the clinic [phone] and follow the prompts.  For any non-urgent questions, you may also contact your provider using MyChart. We now offer e-Visits for anyone 18 and older to request care online for non-urgent symptoms. For details visit [email or web address].   Also download the MyChart app! Go to the app store, search "MyChart", open the app, select [facility], and log in with your MyChart username and password.  Due to Covid, a mask is required upon entering the hospital/clinic. If you do not have a mask, one will be given to you upon arrival. For doctor visits, patients may have 1 support person aged 18 or older with them. For treatment visits, patients cannot have anyone with them due to current Covid guidelines and our immunocompromised population.  

## 2021-09-05 NOTE — Progress Notes (Signed)
Patient presents today for Venofer infusion per providers order.  Vital signs WNL.  Patient has no new complaints at this time.  Peripheral IV started and blood return noted pre and post infusion.  Venofer infusion given today per MD orders.  Stable during infusion without adverse affects.  Vital signs stable.  No complaints at this time.  Discharge from clinic ambulatory in stable condition.  Alert and oriented X 3.  Follow up with [facility] as scheduled.  

## 2021-09-06 ENCOUNTER — Encounter: Payer: Self-pay | Admitting: Family Medicine

## 2021-09-06 ENCOUNTER — Ambulatory Visit (INDEPENDENT_AMBULATORY_CARE_PROVIDER_SITE_OTHER): Payer: Commercial Managed Care - PPO | Admitting: Family Medicine

## 2021-09-06 VITALS — BP 138/89 | HR 91 | Temp 97.4°F | Ht 69.0 in | Wt 229.4 lb

## 2021-09-06 DIAGNOSIS — I10 Essential (primary) hypertension: Secondary | ICD-10-CM | POA: Diagnosis not present

## 2021-09-06 DIAGNOSIS — E782 Mixed hyperlipidemia: Secondary | ICD-10-CM | POA: Diagnosis not present

## 2021-09-06 DIAGNOSIS — E669 Obesity, unspecified: Secondary | ICD-10-CM | POA: Diagnosis not present

## 2021-09-06 MED ORDER — ROSUVASTATIN CALCIUM 5 MG PO TABS: 5.0000 mg | ORAL_TABLET | Freq: Every day | ORAL | 2 refills | Status: DC

## 2021-09-06 MED ORDER — AMLODIPINE BESYLATE 5 MG PO TABS: 5.0000 mg | ORAL_TABLET | Freq: Every day | ORAL | 2 refills | Status: DC

## 2021-09-06 NOTE — Patient Instructions (Signed)
Here is a guide to help us find out which weight loss medications will be covered by your insurance plan.  Please check out this web site  [email or web address] and follow the 3 simple steps.   There is also a phone number you can call if you do not have access to the Internet. [phone] (Monday- Friday 8am-8pm)  [employer] provides coverage information for more than 80% of the inquiries submitted!!  

## 2021-09-06 NOTE — Progress Notes (Signed)
Assessment & Plan:  1. Essential hypertension Uncontrolled. Starting amlodipine.  - amLODipine (NORVASC) 5 MG tablet; Take 1 tablet (5 mg total) by mouth daily.  Dispense: 30 tablet; Refill: 2  2. Mixed hyperlipidemia Uncontrolled. Starting rosuvastatin. Education provided on hyperlipidemia.  - rosuvastatin (CRESTOR) 5 MG tablet; Take 1 tablet (5 mg total) by mouth daily.  Dispense: 30 tablet; Refill: 2  3. Obesity (BMI 30.0-34.9) Advised patient to go to [email or web address] to see if insurance covers any weight loss medications.    Return in about 3 months (around 12/07/2021) for follow-up of chronic medication conditions.  [name], MSN, APRN, FNP-C [facility]  Subjective:    Patient ID: DAGIM TOUSIGNANT, male    DOB: [birth year], 26 y.o.   MRN: [id number]  Patient Care Team: Loman Brooklyn, FNP as PCP - General (Family Medicine)   Chief Complaint:  Chief Complaint  Patient presents with   Hypertension    4 week follow up    HPI: ELVAN OCHSNER is a 26 y.o. male presenting on 09/06/2021 for Hypertension (4 week follow up)  Hypertension: Patient here for follow-up of elevated blood pressure. He is exercising and is adherent to low salt diet.  Blood pressure is not well controlled at home. Cardiac symptoms fatigue. Patient denies chest pain, chest pressure/discomfort, irregular heart beat, near-syncope, palpitations, and syncope.  Cardiovascular risk factors: dyslipidemia, hypertension, male gender, and obesity (BMI >= 30 kg/m2). Use of agents associated with hypertension: none. History of target organ damage: none.  New complaints: Patient recently had fasting lab work completed through his employer at which time he LDL was 190. He is not on a medication to treat.    Social history:  Relevant past medical, surgical, family and social history reviewed and updated as indicated. Interim medical history since our last visit reviewed.  Allergies and  medications reviewed and updated.  DATA REVIEWED: CHART IN EPIC  ROS: Negative unless specifically indicated above in HPI.    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[place])    Hyperlipidemia 12/31/2019   Iron deficiency anemia 03/18/2016   Vitamin D insufficiency 08/08/2021    Past Surgical History:  Procedure Laterality Date   ADENOIDECTOMY     TONSILLECTOMY     TYMPANOSTOMY TUBE PLACEMENT      Social History   Socioeconomic History   Marital status: Single    Spouse name: Not on file   Number of children: Not on file   Years of education: Not on file   Highest education level: Not on file  Occupational History   Not on file  Tobacco Use   Smoking status: Never   Smokeless tobacco: Never  Vaping Use   Vaping Use: Never used  Substance and Sexual Activity   Alcohol use: No   Drug use: No   Sexual activity: Never  Other Topics Concern   Not on file  Social History Narrative   Lives with Mother and brother ; No pets in the house; No smokers in the house.   Social Determinants of Health  Financial Resource Strain: Not on file  Food Insecurity: Not on file  Transportation Needs: Not on file  Physical Activity: Not on file  Stress: Not on file  Social Connections: Not on file  Intimate Partner Violence: Not on file        Objective:    BP 138/89   Pulse 91   Temp (!) 97.4 F (36.3 C) (Temporal)   Ht 5\' 9"  (1.753 m)   Wt 229 lb 6.4 oz (104.1 kg)   SpO2 97%   BMI 33.88 kg/m   Wt Readings from Last 3 Encounters:  09/06/21 229 lb 6.4 oz (104.1 kg)  09/04/21 232 lb 14.4 oz (105.6  kg)  08/07/21 236 lb 12.8 oz (107.4 kg)    Physical Exam Vitals reviewed.  Constitutional:      General: He is not in acute distress.    Appearance: Normal appearance. He is obese. He is not ill-appearing, toxic-appearing or diaphoretic.  HENT:     Head: Normocephalic and atraumatic.  Eyes:     General: No scleral icterus.       Right eye: No discharge.        Left eye: No discharge.     Conjunctiva/sclera: Conjunctivae normal.  Cardiovascular:     Rate and Rhythm: Normal rate and regular rhythm.     Heart sounds: Normal heart sounds. No murmur heard.   No friction rub. No gallop.  Pulmonary:     Effort: Pulmonary effort is normal. No respiratory distress.     Breath sounds: Normal breath sounds. No stridor. No wheezing, rhonchi or rales.  Musculoskeletal:        General: Normal range of motion.     Cervical back: Normal range of motion.  Skin:    General: Skin is warm and dry.  Neurological:     Mental Status: He is alert and oriented to person, place, and time. Mental status is at baseline.  Psychiatric:        Mood and Affect: Mood normal.        Behavior: Behavior normal.        Thought Content: Thought content normal.        Judgment: Judgment normal.    Lab Results  Component Value Date   TSH 0.580 12/29/2019   Lab Results  Component Value Date   WBC 7.7 08/31/2021   HGB 15.3 08/31/2021   HCT 44.6 08/31/2021   MCV 84.6 08/31/2021   PLT 251 08/31/2021   Lab Results  Component Value Date   NA 139 06/14/2021   K 3.8 06/14/2021   CO2 27 06/14/2021   GLUCOSE 114 (H) 06/14/2021   BUN 10 06/14/2021   CREATININE 1.00 06/14/2021   BILITOT 0.9 06/14/2021   ALKPHOS 69 06/14/2021   AST 29 06/14/2021   ALT 37 06/14/2021   PROT 7.6 06/14/2021   ALBUMIN 4.8 06/14/2021   CALCIUM 9.8 06/14/2021   ANIONGAP 11 06/14/2021   Lab Results  Component Value Date   CHOL 213 (H) 12/29/2019   Lab Results  Component Value Date   HDL 40 12/29/2019   Lab Results   Component Value Date   LDLCALC 141 (H) 12/29/2019   Lab Results  Component Value Date   TRIG 176 (H) 12/29/2019   Lab Results  Component Value Date   CHOLHDL 5.3 (H) 12/29/2019   Lab Results  Component Value Date   HGBA1C 5.1 08/07/2021

## 2021-09-18 ENCOUNTER — Encounter: Payer: Self-pay | Admitting: Nurse Practitioner

## 2021-09-18 ENCOUNTER — Ambulatory Visit (INDEPENDENT_AMBULATORY_CARE_PROVIDER_SITE_OTHER): Payer: Commercial Managed Care - PPO | Admitting: Nurse Practitioner

## 2021-09-18 ENCOUNTER — Telehealth: Payer: Self-pay | Admitting: Family Medicine

## 2021-09-18 DIAGNOSIS — I10 Essential (primary) hypertension: Secondary | ICD-10-CM

## 2021-09-18 NOTE — Telephone Encounter (Signed)
Patient was scheduled for an appointment.

## 2021-09-18 NOTE — Telephone Encounter (Signed)
R/C to [place] and wouldn't let me put appt in to be seen

## 2021-09-18 NOTE — Telephone Encounter (Signed)
Pt called requesting to speak with nurse regarding his BP. Says it is running high again, even with him taking his BP medicine. Said the highest its been was this past Friday and it was 188/around 100.  Offered pt an appt to see PCP this afternoon but pt says he cant come in because he is currently in training.   Needs advise on what to do.

## 2021-09-18 NOTE — Progress Notes (Signed)
   Virtual Visit  Note Due to COVID-19 pandemic this visit was conducted virtually. This visit type was conducted due to national recommendations for restrictions regarding the COVID-19 Pandemic (e.g. social distancing, sheltering in place) in an effort to limit this patient's exposure and mitigate transmission in our community. All issues noted in this document were discussed and addressed.  A physical exam was not performed with this format.  I connected with Jeremy Ford on 09/18/21 at 11:am  by telephone and verified that I am speaking with the correct person using two identifiers. Jeremy Ford is currently located at work and mom is on a 3 way call during visit. The provider, Daryll Drown, NP is located in their office at time of visit.  I discussed the limitations, risks, security and privacy concerns of performing an evaluation and management service by telephone and the availability of in person appointments. I also discussed with the patient that there may be a patient responsible charge related to this service. The patient expressed understanding and agreed to proceed.   History and Present Illness:  HPI  Hypertension: Patient here for follow-up of elevated blood pressure. He is exercising and is adherent to low salt diet.  Blood pressure is not well controlled at home. Cardiac symptoms  Elevated blood pressure numbers even with Amlodipine 5 mg tablet by mouth daily . Patient denies chest pain, chest pressure/discomfort, claudication, and dyspnea.  Cardiovascular risk factors: none. Use of agents associated with hypertension: none. History of target organ damage: none.    Review of Systems  Constitutional: Negative.   HENT: Negative.    Eyes: Negative.   Respiratory: Negative.    Cardiovascular: Negative.   Gastrointestinal: Negative.   Genitourinary: Negative.   Skin: Negative.   All other systems reviewed and are negative.   Observations/Objective: Tele-visit  patient not in distress  Assessment and Plan: Take medication as prescribed, take your blood pressure log daily for 7 days, follow up in 2 weeks.with PCP. Limit strenuous activity that may precipitate elevation in BP.   Follow Up Instructions: Follow up with worsening unresolved symptoms    I discussed the assessment and treatment plan with the patient. The patient was provided an opportunity to ask questions and all were answered. The patient agreed with the plan and demonstrated an understanding of the instructions.   The patient was advised to call back or seek an in-person evaluation if the symptoms worsen or if the condition fails to improve as anticipated.  The above assessment and management plan was discussed with the patient. The patient verbalized understanding of and has agreed to the management plan. Patient is aware to call the clinic if symptoms persist or worsen. Patient is aware when to return to the clinic for a follow-up visit. Patient educated on when it is appropriate to go to the emergency department.   Time call ended:  11:15 am   I provided 15 minutes of  non face-to-face time during this encounter.    Daryll Drown, NP

## 2021-09-18 NOTE — Telephone Encounter (Signed)
LM for pt to call back a for an appt tele/virtual/ in office asap

## 2021-10-24 ENCOUNTER — Encounter (HOSPITAL_COMMUNITY): Payer: Self-pay | Admitting: Hematology

## 2021-11-20 ENCOUNTER — Encounter: Payer: Self-pay | Admitting: Family Medicine

## 2021-11-20 ENCOUNTER — Ambulatory Visit (INDEPENDENT_AMBULATORY_CARE_PROVIDER_SITE_OTHER): Payer: Commercial Managed Care - PPO | Admitting: Family Medicine

## 2021-11-20 VITALS — BP 130/88 | HR 60 | Temp 98.1°F | Resp 20 | Ht 69.0 in | Wt 226.0 lb

## 2021-11-20 DIAGNOSIS — E782 Mixed hyperlipidemia: Secondary | ICD-10-CM

## 2021-11-20 DIAGNOSIS — I10 Essential (primary) hypertension: Secondary | ICD-10-CM

## 2021-11-20 DIAGNOSIS — E538 Deficiency of other specified B group vitamins: Secondary | ICD-10-CM | POA: Diagnosis not present

## 2021-11-20 DIAGNOSIS — E559 Vitamin D deficiency, unspecified: Secondary | ICD-10-CM | POA: Diagnosis not present

## 2021-11-20 DIAGNOSIS — E669 Obesity, unspecified: Secondary | ICD-10-CM

## 2021-11-20 MED ORDER — AMLODIPINE BESYLATE 10 MG PO TABS: 10.0000 mg | ORAL_TABLET | Freq: Every day | ORAL | 1 refills | Status: DC

## 2021-11-20 MED ORDER — ROSUVASTATIN CALCIUM 5 MG PO TABS: 5.0000 mg | ORAL_TABLET | Freq: Every day | ORAL | 1 refills | Status: DC

## 2021-11-20 NOTE — Progress Notes (Signed)
Assessment & Plan:  1. Essential hypertension Improving, but not at goal of <130/90. Increase amlodipine from 5 mg to 10 mg daily. Continue monitoring blood pressure at home.  - amLODipine (NORVASC) 10 MG tablet; Take 1 tablet (10 mg total) by mouth daily.  Dispense: 90 tablet; Refill: 1 - CMP14+EGFR - Lipid panel  2. Mixed hyperlipidemia Labs to assess since starting rosuvastatin. - rosuvastatin (CRESTOR) 5 MG tablet; Take 1 tablet (5 mg total) by mouth daily.  Dispense: 90 tablet; Refill: 1 - CMP14+EGFR - Lipid panel  3. Vitamin D insufficiency Labs to assess since starting a vitamin D supplement. - VITAMIN D 25 Hydroxy (Vit-D Deficiency, Fractures)  4. Vitamin B12 deficiency Controlled on current OTC regimen.  5. Obesity (BMI 30.0-34.9) Congratulated on 6 lb weight loss since May.  Encouraged healthy eating and exercise.   Return in about 3 months (around 02/20/2022) for follow-up of chronic medication conditions with Dr. Livia Snellen.  [name], MSN, APRN, FNP-C [facility]  Subjective:    Patient ID: Jeremy Ford, male    DOB: [birth year], 26 y.o.   MRN: [id number]  Patient Care Team: Loman Brooklyn, FNP as PCP - General (Family Medicine)   Chief Complaint:  Chief Complaint  Patient presents with   Medical Management of Chronic Issues    3 mo     HPI: Jeremy Ford is a 26 y.o. male presenting on 11/20/2021 for Medical Management of Chronic Issues (3 mo )  Hypertension: Patient here for follow-up of elevated blood pressure. He was started on amlodipine 5 mg in May. He is exercising and is adherent to low salt diet.  He is no longer drinking caffeine and is only drinking water or Sprite. Blood pressure is not well controlled at home; he is getting systolic readings in the upper 130s. Cardiac symptoms fatigue. Patient denies chest pain, chest pressure/discomfort, irregular heart beat, near-syncope, palpitations, and syncope.   Cardiovascular risk factors: dyslipidemia, hypertension, male gender, and obesity (BMI >= 30 kg/m2). Use of agents associated with hypertension: none. History of target organ damage: none.   Hyperlipidemia: started on rosuvastatin 5 mg in May due to an elevated LDL of 190.   Vitamin D Deficiency: started on OTC vitamin D supplement in April.   Vitamin B12 Deficiency: taking OTC vitamin B12 supplement. Last level normal in May.   New complaints: None   Social history:  Relevant past medical, surgical, family and social history reviewed and updated as indicated. Interim medical history since our last visit reviewed.  Allergies and medications reviewed and updated.  DATA REVIEWED: CHART IN EPIC  ROS: Negative unless specifically indicated above in HPI.    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   ADHD (attention deficit hyperactivity disorder)    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[place])    Hyperlipidemia 12/31/2019   Iron deficiency anemia 03/18/2016   Vitamin D insufficiency 08/08/2021  Past Surgical History:  Procedure Laterality Date   ADENOIDECTOMY     TONSILLECTOMY     TYMPANOSTOMY TUBE PLACEMENT      Social History   Socioeconomic History   Marital status: Single    Spouse name: Not on file   Number of children: Not on file   Years of education: Not on file   Highest education level: Not on file  Occupational History   Occupation: Warehouse manager at [employer]  Tobacco  Use   Smoking status: Never   Smokeless tobacco: Never  Vaping Use   Vaping Use: Never used  Substance and Sexual Activity   Alcohol use: No   Drug use: No   Sexual activity: Never  Other Topics Concern   Not on file  Social History Narrative   Lives with Mother and brother ; No pets in the house; No smokers in the house.   Social Determinants of Health   Financial Resource Strain: Not on file  Food Insecurity: Not on file  Transportation Needs: Not on file  Physical Activity: Not on file  Stress: Not on file  Social Connections: Not on file  Intimate Partner Violence: Not on file        Objective:    BP 130/88   Pulse 60   Temp 98.1 F (36.7 C)   Resp 20   Ht 5' 9"  (1.753 m)   Wt 226 lb (102.5 kg)   SpO2 98%   BMI 33.37 kg/m   Wt Readings from Last 3 Encounters:  11/20/21 226 lb (102.5 kg)  09/06/21 229 lb 6.4 oz (104.1 kg)  09/04/21 232 lb 14.4 oz (105.6 kg)    Physical Exam Vitals reviewed.  Constitutional:      General: He is not in acute distress.    Appearance: Normal appearance. He is obese. He is not ill-appearing, toxic-appearing or diaphoretic.  HENT:     Head: Normocephalic and atraumatic.  Eyes:     General: No scleral icterus.       Right eye: No discharge.        Left eye: No discharge.     Conjunctiva/sclera: Conjunctivae normal.  Cardiovascular:     Rate and Rhythm: Normal rate and regular rhythm.     Heart sounds: Normal heart sounds. No murmur heard.    No friction rub. No gallop.  Pulmonary:     Effort: Pulmonary effort is normal. No respiratory distress.     Breath sounds: Normal breath sounds. No stridor. No wheezing, rhonchi or rales.  Musculoskeletal:        General: Normal range of motion.     Cervical back: Normal range of motion.  Skin:    General: Skin is warm and dry.  Neurological:     Mental Status: He is alert and oriented to person, place, and time. Mental status is at baseline.  Psychiatric:        Mood and  Affect: Mood normal.        Behavior: Behavior normal.        Thought Content: Thought content normal.        Judgment: Judgment normal.     Lab Results  Component Value Date   TSH 0.580 12/29/2019   Lab Results  Component Value Date   WBC 7.7 08/31/2021   HGB 15.3 08/31/2021   HCT 44.6 08/31/2021   MCV 84.6 08/31/2021   PLT 251 08/31/2021   Lab Results  Component Value Date   NA 139  06/14/2021   K 3.8 06/14/2021   CO2 27 06/14/2021   GLUCOSE 114 (H) 06/14/2021   BUN 10 06/14/2021   CREATININE 1.00 06/14/2021   BILITOT 0.9 06/14/2021   ALKPHOS 69 06/14/2021   AST 29 06/14/2021   ALT 37 06/14/2021   PROT 7.6 06/14/2021   ALBUMIN 4.8 06/14/2021   CALCIUM 9.8 06/14/2021   ANIONGAP 11 06/14/2021   Lab Results  Component Value Date   CHOL 248 (A) 08/28/2021   Lab Results  Component Value Date   HDL 42 08/28/2021   Lab Results  Component Value Date   LDLCALC 190 08/28/2021   Lab Results  Component Value Date   TRIG 89 08/28/2021   Lab Results  Component Value Date   CHOLHDL 5.3 (H) 12/29/2019   Lab Results  Component Value Date   HGBA1C 5.1 08/07/2021

## 2021-11-21 ENCOUNTER — Other Ambulatory Visit: Payer: Self-pay | Admitting: Family Medicine

## 2021-11-21 DIAGNOSIS — E782 Mixed hyperlipidemia: Secondary | ICD-10-CM

## 2021-11-21 LAB — CMP14+EGFR
ALT: 25 IU/L (ref 0–44)
AST: 20 IU/L (ref 0–40)
Albumin/Globulin Ratio: 2.1 (ref 1.2–2.2)
Albumin: 4.8 g/dL (ref 4.3–5.2)
Alkaline Phosphatase: 82 IU/L (ref 44–121)
BUN/Creatinine Ratio: 12 (ref 9–20)
BUN: 11 mg/dL (ref 6–20)
Bilirubin Total: 0.7 mg/dL (ref 0.0–1.2)
CO2: 25 mmol/L (ref 20–29)
Calcium: 9.6 mg/dL (ref 8.7–10.2)
Chloride: 100 mmol/L (ref 96–106)
Creatinine, Ser: 0.94 mg/dL (ref 0.76–1.27)
Globulin, Total: 2.3 g/dL (ref 1.5–4.5)
Glucose: 82 mg/dL (ref 70–99)
Potassium: 4 mmol/L (ref 3.5–5.2)
Sodium: 142 mmol/L (ref 134–144)
Total Protein: 7.1 g/dL (ref 6.0–8.5)
eGFR: 117 mL/min/{1.73_m2} (ref >59)

## 2021-11-21 LAB — LIPID PANEL
Chol/HDL Ratio: 5.5 ratio — ABNORMAL HIGH (ref 0.0–5.0)
Cholesterol, Total: 199 mg/dL (ref 100–199)
HDL: 36 mg/dL — ABNORMAL LOW (ref >39)
LDL Chol Calc (NIH): 143 mg/dL — ABNORMAL HIGH (ref 0–99)
Triglycerides: 110 mg/dL (ref 0–149)
VLDL Cholesterol Cal: 20 mg/dL (ref 5–40)

## 2021-11-21 LAB — VITAMIN D 25 HYDROXY (VIT D DEFICIENCY, FRACTURES): Vit D, 25-Hydroxy: 24.2 ng/mL — ABNORMAL LOW (ref 30.0–100.0)

## 2021-11-21 MED ORDER — ROSUVASTATIN CALCIUM 10 MG PO TABS: 10.0000 mg | ORAL_TABLET | Freq: Every day | ORAL | 2 refills | Status: DC

## 2021-12-07 ENCOUNTER — Ambulatory Visit: Payer: No Typology Code available for payment source | Admitting: Family Medicine

## 2022-02-06 ENCOUNTER — Telehealth: Payer: Self-pay | Admitting: Family Medicine

## 2022-02-06 ENCOUNTER — Other Ambulatory Visit: Payer: Self-pay | Admitting: *Deleted

## 2022-02-06 ENCOUNTER — Encounter (HOSPITAL_COMMUNITY): Payer: Self-pay | Admitting: Hematology

## 2022-02-06 DIAGNOSIS — D508 Other iron deficiency anemias: Secondary | ICD-10-CM

## 2022-02-06 DIAGNOSIS — D6941 Evans syndrome: Secondary | ICD-10-CM

## 2022-02-06 DIAGNOSIS — E538 Deficiency of other specified B group vitamins: Secondary | ICD-10-CM

## 2022-02-06 NOTE — Telephone Encounter (Signed)
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 Just wanted to give [place] a heads up.

## 2022-02-06 NOTE — Progress Notes (Signed)
patient called saying that he is feeling weak/tired/fatigued, yawning excessively and these are the same symptoms he was having 6 months ago when he needed iron infusion. He wants to come tomorrow for labs.  Orders placed for CBCd, CMP, Ferritin, Iron/TIBC and Vit D per Dr. Delton Coombes.  We will call patient on Friday with his results and schedule him for anything that may be needed at that time.

## 2022-02-07 ENCOUNTER — Inpatient Hospital Stay: Payer: No Typology Code available for payment source | Attending: Hematology

## 2022-02-07 DIAGNOSIS — D508 Other iron deficiency anemias: Secondary | ICD-10-CM

## 2022-02-07 DIAGNOSIS — R5383 Other fatigue: Secondary | ICD-10-CM | POA: Insufficient documentation

## 2022-02-07 DIAGNOSIS — D591 Autoimmune hemolytic anemia, unspecified: Secondary | ICD-10-CM | POA: Diagnosis present

## 2022-02-07 DIAGNOSIS — D6941 Evans syndrome: Secondary | ICD-10-CM | POA: Diagnosis not present

## 2022-02-07 DIAGNOSIS — E538 Deficiency of other specified B group vitamins: Secondary | ICD-10-CM | POA: Insufficient documentation

## 2022-02-07 DIAGNOSIS — E669 Obesity, unspecified: Secondary | ICD-10-CM | POA: Insufficient documentation

## 2022-02-07 LAB — CBC WITH DIFFERENTIAL/PLATELET
Abs Immature Granulocytes: 0.01 10*3/uL (ref 0.00–0.07)
Basophils Absolute: 0 10*3/uL (ref 0.0–0.1)
Basophils Relative: 1 %
Eosinophils Absolute: 0 10*3/uL (ref 0.0–0.5)
Eosinophils Relative: 1 %
HCT: 44.9 % (ref 39.0–52.0)
Hemoglobin: 15.8 g/dL (ref 13.0–17.0)
Immature Granulocytes: 0 %
Lymphocytes Relative: 38 %
Lymphs Abs: 2 10*3/uL (ref 0.7–4.0)
MCH: 29.5 pg (ref 26.0–34.0)
MCHC: 35.2 g/dL (ref 30.0–36.0)
MCV: 83.8 fL (ref 80.0–100.0)
Monocytes Absolute: 0.5 10*3/uL (ref 0.1–1.0)
Monocytes Relative: 9 %
Neutro Abs: 2.6 10*3/uL (ref 1.7–7.7)
Neutrophils Relative %: 51 %
Platelets: 241 10*3/uL (ref 150–400)
RBC: 5.36 MIL/uL (ref 4.22–5.81)
RDW: 13.4 % (ref 11.5–15.5)
WBC: 5.1 10*3/uL (ref 4.0–10.5)
nRBC: 0 % (ref 0.0–0.2)

## 2022-02-07 LAB — COMPREHENSIVE METABOLIC PANEL
ALT: 38 U/L (ref 0–44)
AST: 26 U/L (ref 15–41)
Albumin: 4.7 g/dL (ref 3.5–5.0)
Alkaline Phosphatase: 74 U/L (ref 38–126)
Anion gap: 9 (ref 5–15)
BUN: 14 mg/dL (ref 6–20)
CO2: 27 mmol/L (ref 22–32)
Calcium: 9.2 mg/dL (ref 8.9–10.3)
Chloride: 106 mmol/L (ref 98–111)
Creatinine, Ser: 0.82 mg/dL (ref 0.61–1.24)
GFR, Estimated: >60 mL/min (ref >60)
Glucose, Bld: 86 mg/dL (ref 70–99)
Potassium: 4 mmol/L (ref 3.5–5.1)
Sodium: 142 mmol/L (ref 135–145)
Total Bilirubin: 0.9 mg/dL (ref 0.3–1.2)
Total Protein: 8.1 g/dL (ref 6.5–8.1)

## 2022-02-07 LAB — IRON AND TIBC
Iron: 71 ug/dL (ref 45–182)
Saturation Ratios: 18 % (ref 17.9–39.5)
TIBC: 403 ug/dL (ref 250–450)
UIBC: 332 ug/dL

## 2022-02-07 LAB — FERRITIN: Ferritin: 219 ng/mL (ref 24–336)

## 2022-02-18 ENCOUNTER — Encounter (HOSPITAL_COMMUNITY): Payer: Self-pay | Admitting: Hematology

## 2022-02-25 ENCOUNTER — Ambulatory Visit: Payer: No Typology Code available for payment source | Admitting: Family Medicine

## 2022-02-27 ENCOUNTER — Encounter (HOSPITAL_COMMUNITY): Payer: Self-pay | Admitting: Hematology

## 2022-02-27 ENCOUNTER — Ambulatory Visit (INDEPENDENT_AMBULATORY_CARE_PROVIDER_SITE_OTHER): Payer: BC Managed Care – PPO | Admitting: Family Medicine

## 2022-02-27 ENCOUNTER — Encounter: Payer: Self-pay | Admitting: Family Medicine

## 2022-02-27 VITALS — BP 134/76 | HR 80 | Temp 98.2°F | Ht 69.0 in | Wt 230.2 lb

## 2022-02-27 DIAGNOSIS — I10 Essential (primary) hypertension: Secondary | ICD-10-CM | POA: Diagnosis not present

## 2022-02-27 DIAGNOSIS — D6941 Evans syndrome: Secondary | ICD-10-CM | POA: Diagnosis not present

## 2022-02-27 DIAGNOSIS — E669 Obesity, unspecified: Secondary | ICD-10-CM | POA: Diagnosis not present

## 2022-02-27 DIAGNOSIS — D508 Other iron deficiency anemias: Secondary | ICD-10-CM

## 2022-02-27 DIAGNOSIS — E782 Mixed hyperlipidemia: Secondary | ICD-10-CM | POA: Diagnosis not present

## 2022-02-27 DIAGNOSIS — E538 Deficiency of other specified B group vitamins: Secondary | ICD-10-CM

## 2022-02-27 DIAGNOSIS — E559 Vitamin D deficiency, unspecified: Secondary | ICD-10-CM

## 2022-02-27 MED ORDER — ROSUVASTATIN CALCIUM 20 MG PO TABS: 20.0000 mg | ORAL_TABLET | Freq: Every day | ORAL | 3 refills | Status: AC

## 2022-02-27 NOTE — Progress Notes (Signed)
Subjective:  Patient ID: Jeremy Ford, male    DOB: 01/18/1996  Age: 26 y.o. MRN: [id number]  CC: Medical Management of Chronic Issues   HPI Jeremy Ford presents for  presents for  follow-up of hypertension. Patient has no history of headache chest pain or shortness of breath or recent cough. Patient also denies symptoms of TIA such as focal numbness or weakness. Patient denies side effects from medication. States taking it regularly.   in for follow-up of elevated cholesterol. Doing well without complaints on current medication. Denies side effects of statin including myalgia and arthralgia and nausea. Currently no chest pain, shortness of breath or other cardiovascular related symptoms noted.  Understands need to decrease weight. Says Britney discussed with Jeremy Ford before she left.   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 Stable since starting rituxan 6 years ago. 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 Followed by Dr. Caren Hazy for it.   Taking Vitamin B12 and vitamin D for deficiencies     02/27/2022    3:11 PM 11/20/2021    8:07 AM 09/06/2021    8:05 AM  Depression screen PHQ 2/9  Decreased Interest 0 0 0  Down, Depressed, Hopeless 0 0 0  PHQ - 2 Score 0 0 0  Altered sleeping  0 0  Tired, decreased energy  0 0  Change in appetite  0 0  Feeling bad or failure about yourself   0 0  Trouble concentrating  0 0  Moving slowly or fidgety/restless  0 0  Suicidal thoughts  0 0  PHQ-9 Score  0 0  Difficult doing work/chores  Not difficult at all Not difficult at all    History Jeremy Ford has a past medical history of Acute ITP ([facility]) (02/15/2016), ADHD (attention deficit hyperactivity disorder), 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[facility]), Hyperlipidemia (12/31/2019), Iron deficiency anemia (03/18/2016), and Vitamin D insufficiency (08/08/2021).   Jeremy Ford has a past surgical history that includes Tonsillectomy; Adenoidectomy; and Tympanostomy tube placement.   Jeremy Ford family history includes Asthma  in Jeremy Ford mother and paternal grandmother; Bladder Cancer in an other family member; Breast cancer in Jeremy Ford maternal grandmother; Colon cancer in Jeremy Ford maternal aunt; Colon polyps in Jeremy Ford father; Diabetes in Jeremy Ford maternal grandfather, mother, paternal grandfather, and paternal grandmother; Hyperlipidemia in Jeremy Ford paternal grandfather; Hypertension in Jeremy Ford maternal grandfather, mother, paternal grandfather, and paternal grandmother; Lung cancer in Jeremy Ford paternal uncle; Skin cancer in Jeremy Ford father and paternal grandfather; Throat cancer in an other family member.Jeremy Ford reports that Jeremy Ford has never smoked. Jeremy Ford has never used smokeless tobacco. Jeremy Ford reports that Jeremy Ford does not drink alcohol and does not use drugs.    ROS Review of Systems  Constitutional: Negative.  Negative for fever.  HENT: Negative.    Eyes:  Negative for visual disturbance.  Respiratory:  Negative for cough and shortness of breath.   Cardiovascular:  Negative for chest pain and leg swelling.  Gastrointestinal:  Negative for abdominal pain, diarrhea, nausea and vomiting.  Genitourinary:  Negative for difficulty urinating.  Musculoskeletal:  Negative for arthralgias and myalgias.  Skin:  Negative for rash.  Neurological:  Negative for headaches.  Psychiatric/Behavioral:  Negative for sleep disturbance.     Objective:  BP 134/76   Pulse 80   Temp 98.2 F (36.8 C)   Ht 5\' 9"  (1.753 m)   Wt 230 lb 3.2 oz (104.4 kg)   SpO2 96%   BMI 33.99 kg/m   BP Readings from Last 3 Encounters:  02/27/22 134/76  11/20/21 130/88  09/06/21 138/89    Wt Readings from Last 3 Encounters:  02/27/22 230 lb 3.2 oz (104.4 kg)  11/20/21 226 lb (102.5 kg)  09/06/21 229 lb 6.4 oz (104.1 kg)     Physical Exam Constitutional:      General: Jeremy Ford is not in acute distress.    Appearance: Jeremy Ford is well-developed.  HENT:     Head: Normocephalic and atraumatic.     Right Ear: External ear normal.     Left Ear: External ear normal.     Nose: Nose normal.  Eyes:      Conjunctiva/sclera: Conjunctivae normal.     Pupils: Pupils are equal, round, and reactive to light.  Cardiovascular:     Rate and Rhythm: Normal rate and regular rhythm.     Heart sounds: Normal heart sounds. No murmur heard. Pulmonary:     Effort: Pulmonary effort is normal. No respiratory distress.     Breath sounds: Normal breath sounds. No wheezing or rales.  Abdominal:     Palpations: Abdomen is soft.     Tenderness: There is no abdominal tenderness.  Musculoskeletal:        General: Normal range of motion.     Cervical back: Normal range of motion and neck supple.  Skin:    General: Skin is warm and dry.  Neurological:     Mental Status: Jeremy Ford is alert and oriented to person, place, and time.     Deep Tendon Reflexes: Reflexes are normal and symmetric.  Psychiatric:        Behavior: Behavior normal.        Thought Content: Thought content normal.        Judgment: Judgment normal.       Assessment & Plan:   Carlie was seen today for medical management of chronic issues.  Diagnoses and all orders for this visit:  Essential hypertension  Mixed hyperlipidemia -     rosuvastatin (CRESTOR) 20 MG tablet; Take 1 tablet (20 mg total) by mouth daily.  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 Dossett's rosuvastatin. I am also having Jeremy Ford maintain Jeremy Ford vitamin B-12, amLODipine, and Vitamin D3.  Allergies as of 02/27/2022       Reactions   Ibuprofen Other (See Comments)   States it messes with Jeremy Ford platelets   Iron    Other reaction(s): Constipation        Medication List        Accurate as of February 27, 2022  6:32 PM. If you have any questions, ask your nurse or doctor.          amLODipine 10 MG tablet Commonly known as: NORVASC Take 1 tablet (10 mg total) by mouth daily.   rosuvastatin 20 MG tablet Commonly known as:  Crestor Take 1 tablet (20 mg total) by mouth daily. What changed:  medication strength how much to take Changed by: Claretta Fraise, MD   vitamin B-12 100 MCG tablet Commonly known as: CYANOCOBALAMIN Take 100 mcg by mouth daily.   Vitamin D3 125 MCG (5000 UT) Caps Take 5,000 Units by mouth daily.         Follow-up: Return in about 6 months (around 08/28/2022) for [facility] physical.  Claretta Fraise, M.D.

## 2022-04-16 ENCOUNTER — Encounter (HOSPITAL_COMMUNITY): Payer: Self-pay | Admitting: Hematology

## 2022-06-20 ENCOUNTER — Other Ambulatory Visit: Payer: Self-pay | Admitting: Family Medicine

## 2022-06-20 DIAGNOSIS — I10 Essential (primary) hypertension: Secondary | ICD-10-CM

## 2022-08-21 ENCOUNTER — Encounter (HOSPITAL_COMMUNITY): Payer: Self-pay | Admitting: Hematology

## 2022-08-28 ENCOUNTER — Inpatient Hospital Stay: Payer: BC Managed Care – PPO | Attending: Hematology

## 2022-08-28 ENCOUNTER — Encounter (HOSPITAL_COMMUNITY): Payer: Self-pay | Admitting: Hematology

## 2022-08-28 DIAGNOSIS — R5383 Other fatigue: Secondary | ICD-10-CM | POA: Insufficient documentation

## 2022-08-28 DIAGNOSIS — D6941 Evans syndrome: Secondary | ICD-10-CM | POA: Insufficient documentation

## 2022-08-28 DIAGNOSIS — D591 Autoimmune hemolytic anemia, unspecified: Secondary | ICD-10-CM | POA: Diagnosis not present

## 2022-08-28 DIAGNOSIS — D509 Iron deficiency anemia, unspecified: Secondary | ICD-10-CM

## 2022-08-28 DIAGNOSIS — E538 Deficiency of other specified B group vitamins: Secondary | ICD-10-CM | POA: Diagnosis not present

## 2022-08-28 LAB — CBC WITH DIFFERENTIAL/PLATELET
Abs Immature Granulocytes: 0.03 10*3/uL (ref 0.00–0.07)
Basophils Absolute: 0.1 10*3/uL (ref 0.0–0.1)
Basophils Relative: 1 %
Eosinophils Absolute: 0 10*3/uL (ref 0.0–0.5)
Eosinophils Relative: 1 %
HCT: 43.7 % (ref 39.0–52.0)
Hemoglobin: 15.4 g/dL (ref 13.0–17.0)
Immature Granulocytes: 1 %
Lymphocytes Relative: 43 %
Lymphs Abs: 2.5 10*3/uL (ref 0.7–4.0)
MCH: 29.2 pg (ref 26.0–34.0)
MCHC: 35.2 g/dL (ref 30.0–36.0)
MCV: 82.9 fL (ref 80.0–100.0)
Monocytes Absolute: 0.6 10*3/uL (ref 0.1–1.0)
Monocytes Relative: 11 %
Neutro Abs: 2.5 10*3/uL (ref 1.7–7.7)
Neutrophils Relative %: 43 %
Platelets: 214 10*3/uL (ref 150–400)
RBC: 5.27 MIL/uL (ref 4.22–5.81)
RDW: 13 % (ref 11.5–15.5)
WBC: 5.8 10*3/uL (ref 4.0–10.5)
nRBC: 0 % (ref 0.0–0.2)

## 2022-08-28 LAB — IRON AND TIBC
Iron: 90 ug/dL (ref 45–182)
Saturation Ratios: 23 % (ref 17.9–39.5)
TIBC: 398 ug/dL (ref 250–450)
UIBC: 308 ug/dL

## 2022-08-28 LAB — VITAMIN B12: Vitamin B-12: 213 pg/mL (ref 180–914)

## 2022-08-28 LAB — FERRITIN: Ferritin: 191 ng/mL (ref 24–336)

## 2022-08-29 ENCOUNTER — Encounter: Payer: BC Managed Care – PPO | Admitting: Family Medicine

## 2022-09-03 NOTE — Progress Notes (Signed)
[facility] [address], [place] [address]    Clinic Day:  09/04/2022  Referring physician: Gwenlyn Fudge, FNP  Patient Care Team: Mechele Claude, MD as PCP - General (Family Medicine)   ASSESSMENT & PLAN:   Assessment: 1.  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  Initially diagnosed on 02/15/2016 when he presented to [facility] pediatric service with epistaxis and mucosal hematomas with a platelet count of < 10,000.  He responded to IVIG with platelet count reaching > 100,000 within 72 hours of treatment, BUT 11 days after treatment, he again relapsed with a platelet count < 10,000 without evidence of bleeding.  He was re-admitted and given IVIG again with ANOTHER TRANSIENT response.  After an initial drop in HGB from 13 to 8 g/dL during his initial hospitalization, his HGB has been between 9-10 g/dL without transfusion.  DAT positive for IgG (PRIOR to IVIG).  Markers for hemolysis have been consistently negative. Completed Rituxan therapy; last dose 04/11/16.     Plan: 1. Jeremy Ford's syndrome: - He does not have any B symptoms.  He does not have any recurrent infections. - Physical exam: No palpable adenopathy or palpable spleen. - Recommend follow-up in 1 year with repeat labs.   2.  Low B12 levels: - He stopped taking B12 tablets few months ago.  B12 level dropped to 213. - Recommend that he start back B12 and take it 3 times a week.  3.  Fatigue: - Ferritin Ford 191 and percent saturation 23.  Hemoglobin Ford 15.4. - He reports severe fatigue. - Recommend Venofer 500 mg IV x 1.    Orders Placed This Encounter  Procedures   CBC with Differential    Standing Status:   Future    Standing Expiration Date:   09/04/2023   Iron and TIBC ([facility] ONLY)    Standing Status:   Future    Standing Expiration Date:   09/04/2023   Ferritin    Standing Status:   Future     Standing Expiration Date:   09/04/2023   Vitamin B12    Standing Status:   Future    Standing Expiration Date:   09/04/2023      I,Katie Daubenspeck,acting as a scribe for Doreatha Massed, MD.,have documented all relevant documentation on the behalf of Doreatha Massed, MD,as directed by  Doreatha Massed, MD while in the presence of Doreatha Massed, MD.   I, Doreatha Massed MD, have reviewed the above documentation for accuracy and completeness, and I agree with the above.   Doreatha Massed, MD   5/22/20245:14 PM  CHIEF COMPLAINT:   Diagnosis: Jeremy Ford's syndrome and IDA    Cancer Staging  No matching staging information was found for the patient.   Prior Therapy: Rituxan therapy, last dose 04/11/16   Current Therapy:  surveillance   HISTORY OF PRESENT ILLNESS:   Oncology History   No history exists.     INTERVAL HISTORY:   Jeremy Ford a 27 y.o. male presenting to clinic today for follow up of Jeremy Ford's syndrome and IDA. He was last seen by me on 09/04/21.  Today, he states that he Ford doing well overall. His appetite level Ford at 100%. His energy level Ford at 50%.  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   ADHD (attention deficit hyperactivity disorder)    Jeremy Ford's syndrome (  HCC)    Hyperlipidemia 12/31/2019   Iron deficiency anemia 03/18/2016   Vitamin D insufficiency 08/08/2021    Surgical History: Past Surgical History:  Procedure Laterality Date   ADENOIDECTOMY     TONSILLECTOMY     TYMPANOSTOMY TUBE PLACEMENT      Social History: Social History   Socioeconomic History   Marital status: Single    Spouse name: Not on file   Number of children: Not on file   Years of education: Not on file   Highest education level: Not on file  Occupational History   Occupation: Psychologist, sport and exercise at [employer]  Tobacco Use   Smoking status: Never   Smokeless tobacco: Never   Vaping Use   Vaping Use: Never used  Substance and Sexual Activity   Alcohol use: No   Drug use: No   Sexual activity: Never  Other Topics Concern   Not on file  Social History Narrative   Lives with Mother and brother ; No pets in the house; No smokers in the house.   Social Determinants of Health   Financial Resource Strain: Not on file  Food Insecurity: Not on file  Transportation Needs: Not on file  Physical Activity: Not on file  Stress: Not on file  Social Connections: Not on file  Intimate Partner Violence: Not on file    Family History: Family History  Problem Relation Age of Onset   Breast cancer Maternal Grandmother    Diabetes Maternal Grandfather    Hypertension Maternal Grandfather    Diabetes Paternal Grandmother    Asthma Paternal Grandmother    Hypertension Paternal Grandmother    Asthma Mother    Diabetes Mother    Hypertension Mother    Skin cancer Father    Colon polyps Father    Colon cancer Maternal Aunt    Lung cancer Paternal Uncle    Diabetes Paternal Grandfather    Hypertension Paternal Grandfather    Hyperlipidemia Paternal Grandfather    Skin cancer Paternal Grandfather    Bladder Cancer Other    Throat cancer Other     Current Medications:  Current Outpatient Medications:    amLODipine (NORVASC) 10 MG tablet, TAKE 1 TABLET BY MOUTH EVERY DAY, Disp: 90 tablet, Rfl: 0   Cholecalciferol (VITAMIN D3) 125 MCG (5000 UT) CAPS, Take 5,000 Units by mouth daily., Disp: , Rfl:    rosuvastatin (CRESTOR) 20 MG tablet, Take 1 tablet (20 mg total) by mouth daily., Disp: 90 tablet, Rfl: 3   vitamin B-12 (CYANOCOBALAMIN) 100 MCG tablet, Take 100 mcg by mouth daily., Disp: , Rfl:    Allergies: Allergies  Allergen Reactions   Ibuprofen Other (See Comments)    States it messes with his platelets   Iron     Other reaction(s): Constipation    REVIEW OF SYSTEMS:   Review of Systems  Constitutional:  Negative for chills, fatigue and fever.   HENT:   Negative for lump/mass, mouth sores, nosebleeds, sore throat and trouble swallowing.   Eyes:  Negative for eye problems.  Respiratory:  Negative for cough and shortness of breath.   Cardiovascular:  Negative for chest pain, leg swelling and palpitations.  Gastrointestinal:  Negative for abdominal pain, constipation, diarrhea, nausea and vomiting.  Genitourinary:  Negative for bladder incontinence, difficulty urinating, dysuria, frequency, hematuria and nocturia.   Musculoskeletal:  Negative for arthralgias, back pain, flank pain, myalgias and neck pain.  Skin:  Negative for itching and rash.  Neurological:  Positive for headaches. Negative  for dizziness and numbness.  Hematological:  Does not bruise/bleed easily.  Psychiatric/Behavioral:  Positive for sleep disturbance. Negative for depression and suicidal ideas. The patient Ford not nervous/anxious.   All other systems reviewed and are negative.    VITALS:   Blood pressure 133/89, pulse 67, temperature 98.5 F (36.9 C), temperature source Oral, resp. rate 17, height 5\' 9"  (1.753 m), weight 232 lb 8 oz (105.5 kg), SpO2 98 %.  Wt Readings from Last 3 Encounters:  09/04/22 232 lb 8 oz (105.5 kg)  02/27/22 230 lb 3.2 oz (104.4 kg)  11/20/21 226 lb (102.5 kg)    Body mass index Ford 34.33 kg/m.  Performance status (ECOG): 0 - Asymptomatic  PHYSICAL EXAM:   Physical Exam Vitals and nursing note reviewed. Exam conducted with a chaperone present.  Constitutional:      Appearance: Normal appearance.  Cardiovascular:     Rate and Rhythm: Normal rate and regular rhythm.     Pulses: Normal pulses.     Heart sounds: Normal heart sounds.  Pulmonary:     Effort: Pulmonary effort Ford normal.     Breath sounds: Normal breath sounds.  Abdominal:     Palpations: Abdomen Ford soft. There Ford no hepatomegaly, splenomegaly or mass.     Tenderness: There Ford no abdominal tenderness.  Musculoskeletal:     Right lower leg: No edema.     Left  lower leg: No edema.  Lymphadenopathy:     Cervical: No cervical adenopathy.     Right cervical: No superficial, deep or posterior cervical adenopathy.    Left cervical: No superficial, deep or posterior cervical adenopathy.     Upper Body:     Right upper body: No supraclavicular or axillary adenopathy.     Left upper body: No supraclavicular or axillary adenopathy.  Neurological:     General: No focal deficit present.     Mental Status: He Ford alert and oriented to person, place, and time.  Psychiatric:        Mood and Affect: Mood normal.        Behavior: Behavior normal.     LABS:      Latest Ref Rng & Units 08/28/2022    9:01 AM 02/07/2022   12:25 PM 08/31/2021    7:24 AM  CBC  WBC 4.0 - 10.5 K/uL 5.8  5.1  7.7   Hemoglobin 13.0 - 17.0 g/dL 16.1  09.6  04.5   Hematocrit 39.0 - 52.0 % 43.7  44.9  44.6   Platelets 150 - 400 K/uL 214  241  251       Latest Ref Rng & Units 02/07/2022   12:25 PM 11/20/2021    9:03 AM 06/14/2021    8:44 PM  CMP  Glucose 70 - 99 mg/dL 86  82  409   BUN 6 - 20 mg/dL 14  11  10    Creatinine 0.61 - 1.24 mg/dL 8.11  9.14  7.82   Sodium 135 - 145 mmol/L 142  142  139   Potassium 3.5 - 5.1 mmol/L 4.0  4.0  3.8   Chloride 98 - 111 mmol/L 106  100  101   CO2 22 - 32 mmol/L 27  25  27    Calcium 8.9 - 10.3 mg/dL 9.2  9.6  9.8   Total Protein 6.5 - 8.1 g/dL 8.1  7.1  7.6   Total Bilirubin 0.3 - 1.2 mg/dL 0.9  0.7  0.9   Alkaline Phos 38 -  126 U/L 74  82  69   AST 15 - 41 U/L 26  20  29    ALT 0 - 44 U/L 38  25  37      No results found for: "CEA1", "CEA" / No results found for: "CEA1", "CEA" No results found for: "PSA1" No results found for: "ZOX096" No results found for: "CAN125"  No results found for: "TOTALPROTELP", "ALBUMINELP", "A1GS", "A2GS", "BETS", "BETA2SER", "GAMS", "MSPIKE", "SPEI" Lab Results  Component Value Date   TIBC 398 08/28/2022   TIBC 403 02/07/2022   TIBC 426 08/31/2021   FERRITIN 191 08/28/2022   FERRITIN 219 02/07/2022    FERRITIN 151 08/31/2021   IRONPCTSAT 23 08/28/2022   IRONPCTSAT 18 02/07/2022   IRONPCTSAT 18 08/31/2021   Lab Results  Component Value Date   LDH 165 08/31/2021   LDH 167 03/30/2021   LDH 130 08/21/2020     STUDIES:   No results found.

## 2022-09-04 ENCOUNTER — Inpatient Hospital Stay (HOSPITAL_BASED_OUTPATIENT_CLINIC_OR_DEPARTMENT_OTHER): Payer: BC Managed Care – PPO | Admitting: Hematology

## 2022-09-04 VITALS — BP 133/89 | HR 67 | Temp 98.5°F | Resp 17 | Ht 69.0 in | Wt 232.5 lb

## 2022-09-04 DIAGNOSIS — D508 Other iron deficiency anemias: Secondary | ICD-10-CM | POA: Diagnosis not present

## 2022-09-04 DIAGNOSIS — E538 Deficiency of other specified B group vitamins: Secondary | ICD-10-CM

## 2022-09-04 DIAGNOSIS — D6941 Evans syndrome: Secondary | ICD-10-CM

## 2022-09-04 DIAGNOSIS — D591 Autoimmune hemolytic anemia, unspecified: Secondary | ICD-10-CM | POA: Diagnosis not present

## 2022-09-04 NOTE — Patient Instructions (Signed)
[facility] at [facility] Discharge Instructions   You were seen and examined today by Dr. Ellin Saba.  He reviewed the results of your lab work which are normal/stable.  We will arrange for you to have an iron infusion.   Restart B12 tablets 1 mg (1000 mcg) on Mondays, Wednesdays, and Fridays.   Return as scheduled.    Thank you for choosing [facility] at [facility] to provide your oncology and hematology care.  To afford each patient quality time with our provider, please arrive at least 15 minutes before your scheduled appointment time.   If you have a lab appointment with the Cancer Center please come in thru the Main Entrance and check in at the main information desk.  You need to re-schedule your appointment should you arrive 10 or more minutes late.  We strive to give you quality time with our providers, and arriving late affects you and other patients whose appointments are after yours.  Also, if you no show three or more times for appointments you may be dismissed from the clinic at the providers discretion.     Again, thank you for choosing [facility].  Our hope is that these requests will decrease the amount of time that you wait before being seen by our physicians.       _____________________________________________________________  Should you have questions after your visit to [facility], please contact our office at [phone] and follow the prompts.  Our office hours are 8:00 a.m. and 4:30 p.m. Monday - Friday.  Please note that voicemails left after 4:00 p.m. may not be returned until the following business day.  We are closed weekends and major holidays.  You do have access to a nurse 24-7, just call the main number to the clinic [phone] and do not press any options, hold on the line and a nurse will answer the phone.    For prescription refill requests, have your pharmacy contact our office  and allow 72 hours.    Due to Covid, you will need to wear a mask upon entering the hospital. If you do not have a mask, a mask will be given to you at the Main Entrance upon arrival. For doctor visits, patients may have 1 support person age 60 or older with them. For treatment visits, patients can not have anyone with them due to social distancing guidelines and our immunocompromised population.

## 2022-09-06 ENCOUNTER — Inpatient Hospital Stay: Payer: BC Managed Care – PPO

## 2022-09-06 VITALS — BP 149/88 | HR 69 | Temp 98.6°F | Resp 16

## 2022-09-06 DIAGNOSIS — E538 Deficiency of other specified B group vitamins: Secondary | ICD-10-CM

## 2022-09-06 DIAGNOSIS — D591 Autoimmune hemolytic anemia, unspecified: Secondary | ICD-10-CM | POA: Diagnosis not present

## 2022-09-06 DIAGNOSIS — D508 Other iron deficiency anemias: Secondary | ICD-10-CM

## 2022-09-06 MED ORDER — CETIRIZINE HCL 10 MG PO TABS
10.0000 mg | ORAL_TABLET | Freq: Once | ORAL | Status: AC
  Administered 2022-09-06: 10 mg via ORAL
  Filled 2022-09-06: qty 1

## 2022-09-06 MED ORDER — SODIUM CHLORIDE 0.9 % IV SOLN
500.0000 mg | Freq: Once | INTRAVENOUS | Status: AC
  Administered 2022-09-06: 500 mg via INTRAVENOUS
  Filled 2022-09-06: qty 20

## 2022-09-06 MED ORDER — SODIUM CHLORIDE 0.9 % IV SOLN: Freq: Once | INTRAVENOUS | Status: AC

## 2022-09-06 MED ORDER — ACETAMINOPHEN 325 MG PO TABS
650.0000 mg | ORAL_TABLET | Freq: Once | ORAL | Status: AC
  Administered 2022-09-06: 650 mg via ORAL
  Filled 2022-09-06: qty 2

## 2022-09-06 NOTE — Progress Notes (Signed)
Venofer infusion given per orders. Patient tolerated it well without problems. Vitals stable and discharged home from clinic ambulatory. Follow up as scheduled.  

## 2022-09-06 NOTE — Progress Notes (Signed)
Patient presents today for iron infusion. Patient is in satisfactory condition with no new complaints voiced.  Vital signs are stable.  We will proceed with infusion per provider orders.    12:55- Venofer IV iron was running when patient called out stating the area near his elbow and the side of his right arm that noted to be the same arm his IV site was in. Pt c/o  burning, red, and warm to the touch in his arm. IV site was not swelling, cool, or bruising. IV stopped and Rebekah Pennington-PA- C at bedside and stated to apply ice and wait 15 minutes to start IV iron in the other arm. After 15 minutes was up pt stated his arm felt better. Pt's IV was restarted in the other arm, and pt voiced no complaints at this time.

## 2022-09-06 NOTE — Patient Instructions (Signed)
[facility]  Discharge Instructions: Thank you for choosing [facility] to provide your oncology and hematology care.  If you have a lab appointment with the Cancer Center - please note that after April 8th, 2024, all labs will be drawn in the cancer center.  You do not have to check in or register with the main entrance as you have in the past but will complete your check-in in the cancer center.  Wear comfortable clothing and clothing appropriate for easy access to any Portacath or PICC line.   We strive to give you quality time with your provider. You may need to reschedule your appointment if you arrive late (15 or more minutes).  Arriving late affects you and other patients whose appointments are after yours.  Also, if you miss three or more appointments without notifying the office, you may be dismissed from the clinic at the provider's discretion.      For prescription refill requests, have your pharmacy contact our office and allow 72 hours for refills to be completed.    Today you received  Venofer 500 mg      BELOW ARE SYMPTOMS THAT SHOULD BE REPORTED IMMEDIATELY: *FEVER GREATER THAN 100.4 F (38 C) OR HIGHER *CHILLS OR SWEATING *NAUSEA AND VOMITING THAT IS NOT CONTROLLED WITH YOUR NAUSEA MEDICATION *UNUSUAL SHORTNESS OF BREATH *UNUSUAL BRUISING OR BLEEDING *URINARY PROBLEMS (pain or burning when urinating, or frequent urination) *BOWEL PROBLEMS (unusual diarrhea, constipation, pain near the anus) TENDERNESS IN MOUTH AND THROAT WITH OR WITHOUT PRESENCE OF ULCERS (sore throat, sores in mouth, or a toothache) UNUSUAL RASH, SWELLING OR PAIN  UNUSUAL VAGINAL DISCHARGE OR ITCHING   Items with * indicate a potential emergency and should be followed up as soon as possible or go to the Emergency Department if any problems should occur.  Please show the CHEMOTHERAPY ALERT CARD or IMMUNOTHERAPY ALERT CARD at check-in to the Emergency Department and  triage nurse.  Should you have questions after your visit or need to cancel or reschedule your appointment, please contact [facility] [phone]  and follow the prompts.  Office hours are 8:00 a.m. to 4:30 p.m. Monday - Friday. Please note that voicemails left after 4:00 p.m. may not be returned until the following business day.  We are closed weekends and major holidays. You have access to a nurse at all times for urgent questions. Please call the main number to the clinic [phone] and follow the prompts.  For any non-urgent questions, you may also contact your provider using MyChart. We now offer e-Visits for anyone 18 and older to request care online for non-urgent symptoms. For details visit [email or web address].   Also download the MyChart app! Go to the app store, search "MyChart", open the app, select [facility], and log in with your MyChart username and password.   

## 2022-09-10 ENCOUNTER — Telehealth: Payer: Self-pay

## 2022-09-10 NOTE — Telephone Encounter (Signed)
Hypersensitivity call back, left voicemail for patient and or his mother to call us back if they have any questions or concerns

## 2022-09-30 ENCOUNTER — Encounter (HOSPITAL_COMMUNITY): Payer: Self-pay | Admitting: Hematology

## 2022-12-20 ENCOUNTER — Other Ambulatory Visit: Payer: Self-pay

## 2022-12-20 DIAGNOSIS — E538 Deficiency of other specified B group vitamins: Secondary | ICD-10-CM

## 2022-12-20 DIAGNOSIS — D509 Iron deficiency anemia, unspecified: Secondary | ICD-10-CM

## 2022-12-23 ENCOUNTER — Inpatient Hospital Stay: Payer: BC Managed Care – PPO | Attending: Hematology

## 2022-12-23 DIAGNOSIS — D509 Iron deficiency anemia, unspecified: Secondary | ICD-10-CM | POA: Diagnosis present

## 2022-12-23 DIAGNOSIS — E538 Deficiency of other specified B group vitamins: Secondary | ICD-10-CM | POA: Diagnosis not present

## 2022-12-23 DIAGNOSIS — D6941 Evans syndrome: Secondary | ICD-10-CM | POA: Diagnosis not present

## 2022-12-23 DIAGNOSIS — D508 Other iron deficiency anemias: Secondary | ICD-10-CM

## 2022-12-23 LAB — FERRITIN: Ferritin: 389 ng/mL — ABNORMAL HIGH (ref 24–336)

## 2022-12-23 LAB — VITAMIN B12: Vitamin B-12: 329 pg/mL (ref 180–914)

## 2022-12-23 LAB — CBC WITH DIFFERENTIAL/PLATELET
Abs Immature Granulocytes: 0.06 10*3/uL (ref 0.00–0.07)
Basophils Absolute: 0 10*3/uL (ref 0.0–0.1)
Basophils Relative: 0 %
Eosinophils Absolute: 0 10*3/uL (ref 0.0–0.5)
Eosinophils Relative: 1 %
HCT: 42.8 % (ref 39.0–52.0)
Hemoglobin: 15 g/dL (ref 13.0–17.0)
Immature Granulocytes: 1 %
Lymphocytes Relative: 35 %
Lymphs Abs: 2.4 10*3/uL (ref 0.7–4.0)
MCH: 29.2 pg (ref 26.0–34.0)
MCHC: 35 g/dL (ref 30.0–36.0)
MCV: 83.3 fL (ref 80.0–100.0)
Monocytes Absolute: 0.5 10*3/uL (ref 0.1–1.0)
Monocytes Relative: 8 %
Neutro Abs: 3.8 10*3/uL (ref 1.7–7.7)
Neutrophils Relative %: 55 %
Platelets: 301 10*3/uL (ref 150–400)
RBC: 5.14 MIL/uL (ref 4.22–5.81)
RDW: 12.8 % (ref 11.5–15.5)
WBC: 6.8 10*3/uL (ref 4.0–10.5)
nRBC: 0 % (ref 0.0–0.2)

## 2022-12-23 LAB — IRON AND TIBC
Iron: 60 ug/dL (ref 45–182)
Saturation Ratios: 16 % — ABNORMAL LOW (ref 17.9–39.5)
TIBC: 377 ug/dL (ref 250–450)
UIBC: 317 ug/dL

## 2023-03-26 ENCOUNTER — Ambulatory Visit: Payer: BC Managed Care – PPO | Admitting: Family Medicine

## 2023-03-26 DIAGNOSIS — J069 Acute upper respiratory infection, unspecified: Secondary | ICD-10-CM | POA: Diagnosis not present

## 2023-03-26 MED ORDER — ALBUTEROL SULFATE HFA 108 (90 BASE) MCG/ACT IN AERS
2.0000 | INHALATION_SPRAY | Freq: Four times a day (QID) | RESPIRATORY_TRACT | 0 refills | Status: DC | PRN
Start: 2023-03-26 — End: 2023-04-22

## 2023-03-26 NOTE — Patient Instructions (Signed)

## 2023-03-26 NOTE — Progress Notes (Signed)
Virtual Visit via Video Note  I connected with [name] on 03/26/23 at 10:20 AM EST by a video enabled telemedicine application and verified that I am speaking with the correct person using two identifiers.  Patient Location: Other:  work  Dispensing optician: Office/Clinic  I discussed the limitations, risks, security, and privacy concerns of performing an evaluation and management service by video and the availability of in person appointments. I also discussed with the patient that there may be a patient responsible charge related to this service. The patient expressed understanding and agreed to proceed.  Subjective: PCP: Mechele Claude, MD  Chief Complaint  Patient presents with   URI   URI   States that he has coughing, sneezing, rhinorrhea, and nasal congestion. Reports that he feels that his is wheezing. His cough is productive with dark green sputum. Started this morning at 0430. 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   ROS: Per HPI  Current Outpatient Medications:    amLODipine (NORVASC) 10 MG tablet, TAKE 1 TABLET BY MOUTH EVERY DAY, Disp: 90 tablet, Rfl: 0   Cholecalciferol (VITAMIN D3) 125 MCG (5000 UT) CAPS, Take 5,000 Units by mouth daily., Disp: , Rfl:    rosuvastatin (CRESTOR) 20 MG tablet, Take 1 tablet (20 mg total) by mouth daily., Disp: 90 tablet, Rfl: 3   vitamin B-12 (CYANOCOBALAMIN) 100 MCG tablet, Take 100 mcg by mouth daily., Disp: , Rfl:   Observations/Objective: There were no vitals filed for this visit. Physical Exam Constitutional:      General: He is awake. He is not in acute distress.    Appearance: Normal appearance. He is well-developed and well-groomed. He is not ill-appearing, toxic-appearing or diaphoretic.  HENT:     Nose: Congestion and rhinorrhea present. Rhinorrhea is clear.  Pulmonary:     Effort: Pulmonary effort is normal.  Neurological:     General: No focal deficit  present.     Mental Status: He is alert and oriented to person, place, and time.  Psychiatric:        Attention and Perception: Attention and perception normal.        Mood and Affect: Mood and affect normal.        Speech: Speech normal.        Behavior: Behavior normal. Behavior is cooperative.        Thought Content: Thought content normal.        Cognition and Memory: Cognition and memory normal.        Judgment: Judgment normal.    Assessment and Plan: 1. Viral upper respiratory tract infection Discussed with patient that likely viral in etiology. Patient declined triple swab and rapid testing. Will send in medication as below for subjective wheezing. Encouraged patient to follow up in person if his symptoms continue. Discussed supportive treatments at home.  - albuterol (VENTOLIN HFA) 108 (90 Base) MCG/ACT inhaler; Inhale 2 puffs into the lungs every 6 (six) hours as needed for wheezing or shortness of breath.  Dispense: 8 g; Refill: 0  Appt time 03/26/2023 10:20 AM Call duration: 00:06:06  Follow Up Instructions: Return if symptoms worsen or fail to improve.   I discussed the assessment and treatment plan with the patient. The patient was provided an opportunity to ask questions, and all were answered. The patient agreed with the plan and demonstrated an understanding of the instructions.   The patient was advised to call back or  seek an in-person evaluation if the symptoms worsen or if the condition fails to improve as anticipated.  The above assessment and management plan was discussed with the patient. The patient verbalized understanding of and has agreed to the management plan.   Neale Burly, DNP-FNP [facility] [address] [place], [place] [address] [phone]

## 2023-04-14 ENCOUNTER — Encounter (HOSPITAL_COMMUNITY): Payer: Self-pay | Admitting: Hematology

## 2023-04-22 ENCOUNTER — Other Ambulatory Visit: Payer: Self-pay | Admitting: Family Medicine

## 2023-04-22 DIAGNOSIS — J069 Acute upper respiratory infection, unspecified: Secondary | ICD-10-CM

## 2023-05-19 ENCOUNTER — Encounter: Payer: Self-pay | Admitting: Family Medicine

## 2023-05-19 ENCOUNTER — Telehealth (INDEPENDENT_AMBULATORY_CARE_PROVIDER_SITE_OTHER): Payer: 59 | Admitting: Family Medicine

## 2023-05-19 ENCOUNTER — Telehealth: Payer: 59 | Admitting: Physician Assistant

## 2023-05-19 ENCOUNTER — Encounter (HOSPITAL_COMMUNITY): Payer: Self-pay | Admitting: Hematology

## 2023-05-19 DIAGNOSIS — J069 Acute upper respiratory infection, unspecified: Secondary | ICD-10-CM

## 2023-05-19 DIAGNOSIS — J4521 Mild intermittent asthma with (acute) exacerbation: Secondary | ICD-10-CM | POA: Diagnosis not present

## 2023-05-19 DIAGNOSIS — J208 Acute bronchitis due to other specified organisms: Secondary | ICD-10-CM | POA: Diagnosis not present

## 2023-05-19 MED ORDER — PREDNISONE 20 MG PO TABS: ORAL_TABLET | ORAL | 0 refills | Status: DC

## 2023-05-19 MED ORDER — AMOXICILLIN 500 MG PO CAPS
500.0000 mg | ORAL_CAPSULE | Freq: Two times a day (BID) | ORAL | 0 refills | Status: DC
Start: 2023-05-19 — End: 2023-09-02

## 2023-05-19 MED ORDER — BENZONATATE 100 MG PO CAPS
100.0000 mg | ORAL_CAPSULE | Freq: Three times a day (TID) | ORAL | 0 refills | Status: DC | PRN
Start: 2023-05-19 — End: 2023-09-02

## 2023-05-19 MED ORDER — PREDNISONE 10 MG (21) PO TBPK
ORAL_TABLET | ORAL | 0 refills | Status: DC
Start: 2023-05-19 — End: 2023-05-19

## 2023-05-19 MED ORDER — ALBUTEROL SULFATE HFA 108 (90 BASE) MCG/ACT IN AERS: 1.0000 | INHALATION_SPRAY | Freq: Four times a day (QID) | RESPIRATORY_TRACT | 1 refills | Status: AC | PRN

## 2023-05-19 NOTE — Progress Notes (Signed)
Virtual Visit via MyChart video note  I connected with Jeremy Ford on 05/19/23 at 1643 by video and verified that I am speaking with the correct person using two identifiers. Jeremy Ford is currently located at home and patient are currently with her during visit. The provider, Elige Radon Kenijah Benningfield, MD is located in their office at time of visit.  Call ended at 1653  I discussed the limitations, risks, security and privacy concerns of performing an evaluation and management service by video and the availability of in person appointments. I also discussed with the patient that there may be a patient responsible charge related to this service. The patient expressed understanding and agreed to proceed.   History and Present Illness: Patient is calling in for cough and chest congestion.  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  He denies fevers or chills but does have body aches.  He worked in [employer]. 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  He has some phlegm coming up.  He is using albuterol sometimes and it helps.  He has been using vicks capsules and it helps some. He is staying hydrated.   1. Mild intermittent asthma with exacerbation   2. Viral upper respiratory tract infection     Outpatient Encounter Medications as of 05/19/2023  Medication Sig   amoxicillin (AMOXIL) 500 MG capsule Take 1 capsule (500 mg total) by mouth 2 (two) times daily.   predniSONE (DELTASONE) 20 MG tablet 2 po at same time daily for 5 days   albuterol (VENTOLIN HFA) 108 (90 Base) MCG/ACT inhaler Inhale 1-2 puffs into the lungs every 6 (six) hours as needed for wheezing or shortness of breath.   amLODipine (NORVASC) 10 MG tablet TAKE 1 TABLET BY MOUTH EVERY DAY   benzonatate (TESSALON) 100 MG capsule Take 1-2 capsules (100-200 mg total) by mouth 3 (three) times daily as needed.   Cholecalciferol (VITAMIN D3) 125 MCG (5000 UT) CAPS Take 5,000 Units by mouth  daily.   rosuvastatin (CRESTOR) 20 MG tablet Take 1 tablet (20 mg total) by mouth daily.   vitamin B-12 (CYANOCOBALAMIN) 100 MCG tablet Take 100 mcg by mouth daily.   [DISCONTINUED] albuterol (VENTOLIN HFA) 108 (90 Base) MCG/ACT inhaler TAKE 2 PUFFS BY MOUTH EVERY 6 HOURS AS NEEDED FOR WHEEZE OR SHORTNESS OF BREATH   [DISCONTINUED] predniSONE (STERAPRED UNI-PAK 21 TAB) 10 MG (21) TBPK tablet 6 day taper; take as directed on package instructions   No facility-administered encounter medications on file as of 05/19/2023.    Review of Systems  Constitutional:  Negative for chills and fever.  HENT:  Positive for congestion, postnasal drip, rhinorrhea and sinus pressure. Negative for ear discharge, ear pain, sneezing, sore throat and voice change.   Eyes:  Negative for pain, discharge, redness and visual disturbance.  Respiratory:  Positive for cough and chest tightness. Negative for shortness of breath and wheezing.   Cardiovascular:  Negative for chest pain and leg swelling.  Musculoskeletal:  Negative for gait problem.  Skin:  Negative for rash.  All other systems reviewed and are negative.   Observations/Objective: Patient sounds comfortable  Assessment and Plan: Problem List Items Addressed This Visit   None Visit Diagnoses       Mild intermittent asthma with exacerbation    -  Primary   Relevant Medications   amoxicillin (AMOXIL) 500 MG capsule   predniSONE (DELTASONE) 20 MG tablet   albuterol (VENTOLIN HFA)  108 (90 Base) MCG/ACT inhaler     Viral upper respiratory tract infection       Relevant Medications   amoxicillin (AMOXIL) 500 MG capsule   predniSONE (DELTASONE) 20 MG tablet   albuterol (VENTOLIN HFA) 108 (90 Base) MCG/ACT inhaler     Based on history and chest tightness it sounds like he is possibly having an early asthma exacerbation we will go ahead and treat accordingly with the prednisone albuterol and amoxicillin just in case he needs it.  Follow up plan: Return  if symptoms worsen or fail to improve.     I discussed the assessment and treatment plan with the patient. The patient was provided an opportunity to ask questions and all were answered. The patient agreed with the plan and demonstrated an understanding of the instructions.   The patient was advised to call back or seek an in-person evaluation if the symptoms worsen or if the condition fails to improve as anticipated.  The above assessment and management plan was discussed with the patient. The patient verbalized understanding of and has agreed to the management plan. Patient is aware to call the clinic if symptoms persist or worsen. Patient is aware when to return to the clinic for a follow-up visit. Patient educated on when it is appropriate to go to the emergency department.    I provided 10 minutes of non-face-to-face time during this encounter.    Nils Pyle, MD

## 2023-05-19 NOTE — Progress Notes (Signed)
 E-Visit for Cough  We are sorry that you are not feeling well.  Here is how we plan to help!  Based on your presentation I believe you most likely have A cough due to a virus.  This is called viral bronchitis and is best treated by rest, plenty of fluids and control of the cough.  You may use Ibuprofen or Tylenol as directed to help your symptoms.     In addition you may use A non-prescription cough medication called Mucinex DM: take 2 tablets every 12 hours. and A prescription cough medication called Tessalon Perles 100mg . You may take 1-2 capsules every 8 hours as needed for your cough.  Prednisone 10 mg daily for 6 days (see taper instructions below)  Directions for 6 day taper: Day 1: 2 tablets before breakfast, 1 after both lunch & dinner and 2 at bedtime Day 2: 1 tab before breakfast, 1 after both lunch & dinner and 2 at bedtime Day 3: 1 tab at each meal & 1 at bedtime Day 4: 1 tab at breakfast, 1 at lunch, 1 at bedtime Day 5: 1 tab at breakfast & 1 tab at bedtime Day 6: 1 tab at breakfast  From your responses in the eVisit questionnaire you describe inflammation in the upper respiratory tract which is causing a significant cough.  This is commonly called Bronchitis and has four common causes:   Allergies Viral Infections Acid Reflux Bacterial Infection Allergies, viruses and acid reflux are treated by controlling symptoms or eliminating the cause. An example might be a cough caused by taking certain blood pressure medications. You stop the cough by changing the medication. Another example might be a cough caused by acid reflux. Controlling the reflux helps control the cough.  USE OF BRONCHODILATOR ("RESCUE") INHALERS: There is a risk from using your bronchodilator too frequently.  The risk is that over-reliance on a medication which only relaxes the muscles surrounding the breathing tubes can reduce the effectiveness of medications prescribed to reduce swelling and congestion of the  tubes themselves.  Although you feel brief relief from the bronchodilator inhaler, your asthma may actually be worsening with the tubes becoming more swollen and filled with mucus.  This can delay other crucial treatments, such as oral steroid medications. If you need to use a bronchodilator inhaler daily, several times per day, you should discuss this with your provider.  There are probably better treatments that could be used to keep your asthma under control.     HOME CARE Only take medications as instructed by your medical team. Complete the entire course of an antibiotic. Drink plenty of fluids and get plenty of rest. Avoid close contacts especially the very young and the elderly Cover your mouth if you cough or cough into your sleeve. Always remember to wash your hands A steam or ultrasonic humidifier can help congestion.   GET HELP RIGHT AWAY IF: You develop worsening fever. You become short of breath You cough up blood. Your symptoms persist after you have completed your treatment plan MAKE SURE YOU  Understand these instructions. Will watch your condition. Will get help right away if you are not doing well or get worse.    Thank you for choosing an e-visit.  Your e-visit answers were reviewed by a board certified advanced clinical practitioner to complete your personal care plan. Depending upon the condition, your plan could have included both over the counter or prescription medications.  Please review your pharmacy choice. Make sure the pharmacy is  open so you can pick up prescription now. If there is a problem, you may contact your provider through [employer] and have the prescription routed to another pharmacy.  Your safety is important to [place]. If you have drug allergies check your prescription carefully.   For the next 24 hours you can use MyChart to ask questions about today's visit, request a non-urgent call back, or ask for a work or school excuse. You will get an  email in the next two days asking about your experience. I hope that your e-visit has been valuable and will speed your recovery.  I have spent 5 minutes in review of e-visit questionnaire, review and updating patient chart, medical decision making and response to patient.   Margaretann Loveless, PA-C

## 2023-05-20 ENCOUNTER — Telehealth: Payer: Self-pay | Admitting: Family Medicine

## 2023-05-20 NOTE — Telephone Encounter (Signed)
Apt scheduled for video visit yesterday  Copied from CRM [id number]. Topic: Appointments - Appointment Info/Confirmation >> May 19, 2023  3:55 PM Jeremy Ford wrote: Patient/patient representative is calling for information regarding an appointment.

## 2023-05-21 ENCOUNTER — Other Ambulatory Visit: Payer: Self-pay | Admitting: *Deleted

## 2023-05-21 ENCOUNTER — Telehealth: Payer: Self-pay | Admitting: *Deleted

## 2023-05-21 DIAGNOSIS — D508 Other iron deficiency anemias: Secondary | ICD-10-CM

## 2023-05-21 DIAGNOSIS — D6941 Evans syndrome: Secondary | ICD-10-CM

## 2023-05-21 DIAGNOSIS — D509 Iron deficiency anemia, unspecified: Secondary | ICD-10-CM

## 2023-05-21 DIAGNOSIS — E538 Deficiency of other specified B group vitamins: Secondary | ICD-10-CM

## 2023-05-21 NOTE — Telephone Encounter (Signed)
 Patient called to advise that he has a papular rash to back and had experienced nosebleeds over the past couple of days.  Is currently recovering from Bronchitis.  Per Dr. Katragadda, will bring in for labs in the am and follow up once resulted.  Patient aware.

## 2023-05-22 ENCOUNTER — Inpatient Hospital Stay: Payer: BLUE CROSS/BLUE SHIELD | Attending: Hematology | Admitting: Hematology

## 2023-05-22 ENCOUNTER — Encounter (HOSPITAL_COMMUNITY): Payer: Self-pay | Admitting: Hematology

## 2023-05-22 DIAGNOSIS — Z79899 Other long term (current) drug therapy: Secondary | ICD-10-CM | POA: Insufficient documentation

## 2023-05-22 DIAGNOSIS — D508 Other iron deficiency anemias: Secondary | ICD-10-CM

## 2023-05-22 DIAGNOSIS — D6941 Evans syndrome: Secondary | ICD-10-CM | POA: Diagnosis present

## 2023-05-22 DIAGNOSIS — E538 Deficiency of other specified B group vitamins: Secondary | ICD-10-CM | POA: Diagnosis present

## 2023-05-22 DIAGNOSIS — D509 Iron deficiency anemia, unspecified: Secondary | ICD-10-CM | POA: Diagnosis present

## 2023-05-22 LAB — CBC WITH DIFFERENTIAL/PLATELET
Abs Immature Granulocytes: 0.06 10*3/uL (ref 0.00–0.07)
Basophils Absolute: 0 10*3/uL (ref 0.0–0.1)
Basophils Relative: 0 %
Eosinophils Absolute: 0 10*3/uL (ref 0.0–0.5)
Eosinophils Relative: 0 %
HCT: 46.2 % (ref 39.0–52.0)
Hemoglobin: 16 g/dL (ref 13.0–17.0)
Immature Granulocytes: 1 %
Lymphocytes Relative: 40 %
Lymphs Abs: 2.6 10*3/uL (ref 0.7–4.0)
MCH: 29 pg (ref 26.0–34.0)
MCHC: 34.6 g/dL (ref 30.0–36.0)
MCV: 83.7 fL (ref 80.0–100.0)
Monocytes Absolute: 1 10*3/uL (ref 0.1–1.0)
Monocytes Relative: 16 %
Neutro Abs: 2.8 10*3/uL (ref 1.7–7.7)
Neutrophils Relative %: 43 %
Platelets: 268 10*3/uL (ref 150–400)
RBC: 5.52 MIL/uL (ref 4.22–5.81)
RDW: 12.8 % (ref 11.5–15.5)
WBC: 6.5 10*3/uL (ref 4.0–10.5)
nRBC: 0 % (ref 0.0–0.2)

## 2023-05-22 LAB — IRON AND TIBC
Iron: 109 ug/dL (ref 45–182)
Saturation Ratios: 31 % (ref 17.9–39.5)
TIBC: 349 ug/dL (ref 250–450)
UIBC: 240 ug/dL

## 2023-05-22 LAB — FERRITIN: Ferritin: 691 ng/mL — ABNORMAL HIGH (ref 24–336)

## 2023-05-22 LAB — LACTATE DEHYDROGENASE: LDH: 150 U/L (ref 98–192)

## 2023-05-22 LAB — VITAMIN B12: Vitamin B-12: 435 pg/mL (ref 180–914)

## 2023-05-22 NOTE — Progress Notes (Signed)
 Dr. Cheree Cords reviewed labs and has recommended he follow up with PCP for symptoms.  Patient made aware and verbalized understanding.

## 2023-05-26 ENCOUNTER — Encounter (HOSPITAL_COMMUNITY): Payer: Self-pay | Admitting: Hematology

## 2023-05-29 ENCOUNTER — Encounter (HOSPITAL_COMMUNITY): Payer: Self-pay | Admitting: Hematology

## 2023-08-25 ENCOUNTER — Other Ambulatory Visit: Payer: Self-pay

## 2023-08-25 DIAGNOSIS — D6941 Evans syndrome: Secondary | ICD-10-CM

## 2023-08-25 DIAGNOSIS — E538 Deficiency of other specified B group vitamins: Secondary | ICD-10-CM

## 2023-08-25 DIAGNOSIS — D508 Other iron deficiency anemias: Secondary | ICD-10-CM

## 2023-08-25 DIAGNOSIS — D509 Iron deficiency anemia, unspecified: Secondary | ICD-10-CM

## 2023-08-26 ENCOUNTER — Inpatient Hospital Stay: Payer: BC Managed Care – PPO

## 2023-08-29 ENCOUNTER — Inpatient Hospital Stay: Attending: Hematology

## 2023-08-29 DIAGNOSIS — R5383 Other fatigue: Secondary | ICD-10-CM | POA: Diagnosis not present

## 2023-08-29 DIAGNOSIS — D6941 Evans syndrome: Secondary | ICD-10-CM | POA: Diagnosis present

## 2023-08-29 DIAGNOSIS — D591 Autoimmune hemolytic anemia, unspecified: Secondary | ICD-10-CM | POA: Diagnosis not present

## 2023-08-29 DIAGNOSIS — E538 Deficiency of other specified B group vitamins: Secondary | ICD-10-CM | POA: Insufficient documentation

## 2023-08-29 DIAGNOSIS — D508 Other iron deficiency anemias: Secondary | ICD-10-CM

## 2023-08-29 DIAGNOSIS — D509 Iron deficiency anemia, unspecified: Secondary | ICD-10-CM | POA: Diagnosis present

## 2023-08-29 LAB — CBC WITH DIFFERENTIAL/PLATELET
Abs Immature Granulocytes: 0.03 10*3/uL (ref 0.00–0.07)
Basophils Absolute: 0 10*3/uL (ref 0.0–0.1)
Basophils Relative: 1 %
Eosinophils Absolute: 0.1 10*3/uL (ref 0.0–0.5)
Eosinophils Relative: 1 %
HCT: 43.4 % (ref 39.0–52.0)
Hemoglobin: 15.4 g/dL (ref 13.0–17.0)
Immature Granulocytes: 1 %
Lymphocytes Relative: 30 %
Lymphs Abs: 2 10*3/uL (ref 0.7–4.0)
MCH: 29.6 pg (ref 26.0–34.0)
MCHC: 35.5 g/dL (ref 30.0–36.0)
MCV: 83.3 fL (ref 80.0–100.0)
Monocytes Absolute: 0.7 10*3/uL (ref 0.1–1.0)
Monocytes Relative: 11 %
Neutro Abs: 3.8 10*3/uL (ref 1.7–7.7)
Neutrophils Relative %: 56 %
Platelets: 205 10*3/uL (ref 150–400)
RBC: 5.21 MIL/uL (ref 4.22–5.81)
RDW: 12.9 % (ref 11.5–15.5)
WBC: 6.6 10*3/uL (ref 4.0–10.5)
nRBC: 0 % (ref 0.0–0.2)

## 2023-08-29 LAB — VITAMIN B12: Vitamin B-12: 269 pg/mL (ref 180–914)

## 2023-08-29 LAB — IRON AND TIBC
Iron: 79 ug/dL (ref 45–182)
Saturation Ratios: 20 % (ref 17.9–39.5)
TIBC: 398 ug/dL (ref 250–450)
UIBC: 319 ug/dL

## 2023-08-29 LAB — LACTATE DEHYDROGENASE: LDH: 152 U/L (ref 98–192)

## 2023-08-29 LAB — FERRITIN: Ferritin: 281 ng/mL (ref 24–336)

## 2023-09-01 NOTE — Progress Notes (Signed)
 [facility] [address], [place] [address]    Clinic Day:  09/02/2023  Referring physician: Roise Cleaver, MD  Patient Care Team: Roise Cleaver, MD as PCP - General (Family Medicine)   ASSESSMENT & PLAN:   Assessment: 1.  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  Initially diagnosed on 02/15/2016 when he presented to [facility] pediatric service with epistaxis and mucosal hematomas with a platelet count of < 10,000.  He responded to IVIG with platelet count reaching > 100,000 within 72 hours of treatment, BUT 11 days after treatment, he again relapsed with a platelet count < 10,000 without evidence of bleeding.  He was re-admitted and given IVIG again with ANOTHER TRANSIENT response.  After an initial drop in HGB from 13 to 8 g/dL during his initial hospitalization, his HGB has been between 9-10 g/dL without transfusion.  DAT positive for IgG (PRIOR to IVIG).  Markers for hemolysis have been consistently negative. Completed Rituxan  therapy; last dose 04/11/16.     Plan: 1. Jeremy Ford: - He does not have any B symptoms or recurrent infections. - Physical exam: No palpable adenopathy or splenomegaly. - Return to clinic in 1 year with repeat labs.   2.  Low B12 levels: - He stopped taking B12 supplements.  B12 is 269.  Continue to monitor in a year.  3.  Fatigue: - Received Venofer  500 mg on 09/06/2022.  Reports improvement in fatigue.  Recent ferritin is 281 iron  saturation 20 with normal hemoglobin.  No indication for parenteral iron  therapy.    Orders Placed This Encounter  Procedures   CBC with Differential    Standing Status:   Future    Expected Date:   08/23/2024    Expiration Date:   09/01/2024   Iron  and TIBC ([facility] ONLY)    Standing Status:   Future    Expected Date:   08/23/2024    Expiration Date:   09/01/2024   Ferritin    Standing Status:    Future    Expected Date:   08/23/2024    Expiration Date:   09/01/2024   Vitamin B12    Standing Status:   Future    Expected Date:   08/23/2024    Expiration Date:   09/01/2024   Lactate dehydrogenase    Standing Status:   Future    Expected Date:   08/23/2024    Expiration Date:   09/01/2024   Methylmalonic acid, serum    Standing Status:   Future    Expected Date:   08/23/2024    Expiration Date:   09/01/2024   CBC with Differential    Standing Status:   Future    Expected Date:   03/01/2024    Expiration Date:   09/01/2024   Iron  and TIBC ([facility] ONLY)    Standing Status:   Future    Expected Date:   03/01/2024    Expiration Date:   09/01/2024   Ferritin    Standing Status:   Future    Expected Date:   03/01/2024    Expiration Date:   09/01/2024   Vitamin B12    Standing Status:   Future    Expected Date:   03/01/2024    Expiration Date:   09/01/2024   Lactate dehydrogenase    Standing Status:   Future    Expected Date:   03/01/2024    Expiration Date:  09/01/2024      I,Helena R Teague,acting as a scribe for Paulett Boros, MD.,have documented all relevant documentation on the behalf of Paulett Boros, MD,as directed by  Paulett Boros, MD while in the presence of Paulett Boros, MD.  I, Paulett Boros MD, have reviewed the above documentation for accuracy and completeness, and I agree with the above.]    Paulett Boros, MD   5/20/20259:01 AM  CHIEF COMPLAINT:   Diagnosis: Jeremy Ford and IDA    Cancer Staging  No matching staging information was found for the patient.    Prior Therapy: Rituxan  therapy, last dose 04/11/16   Current Therapy:  surveillance   HISTORY OF PRESENT ILLNESS:   Oncology History   No history exists.     INTERVAL HISTORY:   Jeremy Ford is a 28 y.o. male presenting to clinic today for follow up of Jeremy Ford and IDA. He was last seen by me on 09/04/22.  Today, he states that  he is doing well overall. His appetite level is at 100%. His energy level is at 100%.  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   ADHD (attention deficit hyperactivity disorder)    Jeremy Ford (HCC)    Hyperlipidemia 12/31/2019   Iron  deficiency anemia 03/18/2016   Vitamin D  insufficiency 08/08/2021    Surgical History: Past Surgical History:  Procedure Laterality Date   ADENOIDECTOMY     TONSILLECTOMY     TYMPANOSTOMY TUBE PLACEMENT      Social History: Social History   Socioeconomic History   Marital status: Single    Spouse name: Not on file   Number of children: Not on file   Years of education: Not on file   Highest education level: 12th grade  Occupational History   Occupation: Psychologist, sport and exercise at [employer]  Tobacco Use   Smoking status: Never   Smokeless tobacco: Never  Vaping Use   Vaping status: Never Used  Substance and Sexual Activity   Alcohol use: No   Drug use: No   Sexual activity: Never  Other Topics Concern   Not on file  Social History Narrative   Lives with Mother and brother ; No pets in the house; No smokers in the house.   Social Drivers of Corporate investment banker Strain: Not on file  Food Insecurity: Not on file  Transportation Needs: Unknown (03/26/2023)   PRAPARE - Administrator, Civil Service (Medical): Patient declined    Lack of Transportation (Non-Medical): Not on file  Physical Activity: Not on file  Stress: Not on file  Social Connections: Unknown (03/26/2023)   Social Connection and Isolation Panel [NHANES]    Frequency of Communication with Friends and Family: Not on file    Frequency of Social Gatherings with Friends and Family: Not on file    Attends Religious Services: Not on file    Active Member of Clubs or Organizations: Not on file    Attends Banker Meetings: Not on file    Marital Status: Never married   Intimate Partner Violence: Not on file    Family History: Family History  Problem Relation Age of Onset   Breast cancer Maternal Grandmother    Diabetes Maternal Grandfather    Hypertension Maternal Grandfather    Diabetes Paternal Grandmother    Asthma Paternal Grandmother    Hypertension Paternal Grandmother    Asthma Mother    Diabetes  Mother    Hypertension Mother    Skin cancer Father    Colon polyps Father    Colon cancer Maternal Aunt    Lung cancer Paternal Uncle    Diabetes Paternal Grandfather    Hypertension Paternal Grandfather    Hyperlipidemia Paternal Grandfather    Skin cancer Paternal Grandfather    Bladder Cancer Other    Throat cancer Other     Current Medications:  Current Outpatient Medications:    albuterol  (VENTOLIN  HFA) 108 (90 Base) MCG/ACT inhaler, Inhale 1-2 puffs into the lungs every 6 (six) hours as needed for wheezing or shortness of breath., Disp: 18 each, Rfl: 1   amLODipine  (NORVASC ) 10 MG tablet, TAKE 1 TABLET BY MOUTH EVERY DAY, Disp: 90 tablet, Rfl: 0   Cholecalciferol (VITAMIN D3) 125 MCG (5000 UT) CAPS, Take 5,000 Units by mouth daily., Disp: , Rfl:    rosuvastatin  (CRESTOR ) 20 MG tablet, Take 1 tablet (20 mg total) by mouth daily., Disp: 90 tablet, Rfl: 3   vitamin B-12 (CYANOCOBALAMIN ) 100 MCG tablet, Take 100 mcg by mouth daily., Disp: , Rfl:    Allergies: Allergies  Allergen Reactions   Ibuprofen Other (See Comments)    States it messes with his platelets   Iron      Other reaction(s): Constipation    REVIEW OF SYSTEMS:   Review of Systems  Constitutional:  Negative for chills, fatigue and fever.  HENT:   Negative for lump/mass, mouth sores, nosebleeds, sore throat and trouble swallowing.   Eyes:  Negative for eye problems.  Respiratory:  Negative for cough and shortness of breath.   Cardiovascular:  Negative for chest pain, leg swelling and palpitations.  Gastrointestinal:  Negative for abdominal pain, constipation,  diarrhea, nausea and vomiting.  Genitourinary:  Negative for bladder incontinence, difficulty urinating, dysuria, frequency, hematuria and nocturia.   Musculoskeletal:  Negative for arthralgias, back pain, flank pain, myalgias and neck pain.  Skin:  Negative for itching and rash.  Neurological:  Positive for headaches. Negative for dizziness and numbness.  Hematological:  Does not bruise/bleed easily.  Psychiatric/Behavioral:  Negative for depression, sleep disturbance and suicidal ideas. The patient is not nervous/anxious.   All other systems reviewed and are negative.    VITALS:   Blood pressure (!) 145/97, pulse 74, temperature 98 F (36.7 C), temperature source Oral, resp. rate 16, weight 221 lb 5.5 oz (100.4 kg), SpO2 100%.  Wt Readings from Last 3 Encounters:  09/02/23 221 lb 5.5 oz (100.4 kg)  09/04/22 232 lb 8 oz (105.5 kg)  02/27/22 230 lb 3.2 oz (104.4 kg)    Body mass index is 32.69 kg/m.  Performance status (ECOG): 0 - Asymptomatic  PHYSICAL EXAM:   Physical Exam Vitals and nursing note reviewed. Exam conducted with a chaperone present.  Constitutional:      Appearance: Normal appearance.  Cardiovascular:     Rate and Rhythm: Normal rate and regular rhythm.     Pulses: Normal pulses.     Heart sounds: Normal heart sounds.  Pulmonary:     Effort: Pulmonary effort is normal.     Breath sounds: Normal breath sounds.  Abdominal:     Palpations: Abdomen is soft. There is no hepatomegaly, splenomegaly or mass.     Tenderness: There is no abdominal tenderness.  Musculoskeletal:     Right lower leg: No edema.     Left lower leg: No edema.  Lymphadenopathy:     Cervical: No cervical adenopathy.     Right  cervical: No superficial, deep or posterior cervical adenopathy.    Left cervical: No superficial, deep or posterior cervical adenopathy.     Upper Body:     Right upper body: No supraclavicular or axillary adenopathy.     Left upper body: No supraclavicular or  axillary adenopathy.  Neurological:     General: No focal deficit present.     Mental Status: He is alert and oriented to person, place, and time.  Psychiatric:        Mood and Affect: Mood normal.        Behavior: Behavior normal.     LABS:      Latest Ref Rng & Units 08/29/2023   12:56 PM 05/22/2023    7:37 AM 12/23/2022    1:47 PM  CBC  WBC 4.0 - 10.5 K/uL 6.6  6.5  6.8   Hemoglobin 13.0 - 17.0 g/dL 81.1  91.4  78.2   Hematocrit 39.0 - 52.0 % 43.4  46.2  42.8   Platelets 150 - 400 K/uL 205  268  301       Latest Ref Rng & Units 02/07/2022   12:25 PM 11/20/2021    9:03 AM 06/14/2021    8:44 PM  CMP  Glucose 70 - 99 mg/dL 86  82  956   BUN 6 - 20 mg/dL 14  11  10    Creatinine 0.61 - 1.24 mg/dL 2.13  0.86  5.78   Sodium 135 - 145 mmol/L 142  142  139   Potassium 3.5 - 5.1 mmol/L 4.0  4.0  3.8   Chloride 98 - 111 mmol/L 106  100  101   CO2 22 - 32 mmol/L 27  25  27    Calcium  8.9 - 10.3 mg/dL 9.2  9.6  9.8   Total Protein 6.5 - 8.1 g/dL 8.1  7.1  7.6   Total Bilirubin 0.3 - 1.2 mg/dL 0.9  0.7  0.9   Alkaline Phos 38 - 126 U/L 74  82  69   AST 15 - 41 U/L 26  20  29    ALT 0 - 44 U/L 38  25  37      No results found for: "CEA1", "CEA" / No results found for: "CEA1", "CEA" No results found for: "PSA1" No results found for: "ION629" No results found for: "CAN125"  No results found for: "TOTALPROTELP", "ALBUMINELP", "A1GS", "A2GS", "BETS", "BETA2SER", "GAMS", "MSPIKE", "SPEI" Lab Results  Component Value Date   TIBC 398 08/29/2023   TIBC 349 05/22/2023   TIBC 377 12/23/2022   FERRITIN 281 08/29/2023   FERRITIN 691 (H) 05/22/2023   FERRITIN 389 (H) 12/23/2022   IRONPCTSAT 20 08/29/2023   IRONPCTSAT 31 05/22/2023   IRONPCTSAT 16 (L) 12/23/2022   Lab Results  Component Value Date   LDH 152 08/29/2023   LDH 150 05/22/2023   LDH 165 08/31/2021     STUDIES:   No results found.

## 2023-09-02 ENCOUNTER — Inpatient Hospital Stay (HOSPITAL_BASED_OUTPATIENT_CLINIC_OR_DEPARTMENT_OTHER): Payer: BC Managed Care – PPO | Admitting: Hematology

## 2023-09-02 VITALS — BP 145/97 | HR 74 | Temp 98.0°F | Resp 16 | Wt 221.3 lb

## 2023-09-02 DIAGNOSIS — D509 Iron deficiency anemia, unspecified: Secondary | ICD-10-CM

## 2023-09-02 DIAGNOSIS — D6941 Evans syndrome: Secondary | ICD-10-CM

## 2023-09-02 DIAGNOSIS — E538 Deficiency of other specified B group vitamins: Secondary | ICD-10-CM

## 2023-09-02 NOTE — Patient Instructions (Signed)
 [facility] at [facility] Discharge Instructions   You were seen and examined today by Dr. Ellin Saba.  He reviewed the results of your lab work which are normal/stable.   We will see you back in 1 year. We will repeat lab work prior to this visit.   Return as scheduled.    Thank you for choosing [facility] at [facility] to provide your oncology and hematology care.  To afford each patient quality time with our provider, please arrive at least 15 minutes before your scheduled appointment time.   If you have a lab appointment with the Cancer Center please come in thru the Main Entrance and check in at the main information desk.  You need to re-schedule your appointment should you arrive 10 or more minutes late.  We strive to give you quality time with our providers, and arriving late affects you and other patients whose appointments are after yours.  Also, if you no show three or more times for appointments you may be dismissed from the clinic at the providers discretion.     Again, thank you for choosing [facility].  Our hope is that these requests will decrease the amount of time that you wait before being seen by our physicians.       _____________________________________________________________  Should you have questions after your visit to [facility], please contact our office at [phone] and follow the prompts.  Our office hours are 8:00 a.m. and 4:30 p.m. Monday - Friday.  Please note that voicemails left after 4:00 p.m. may not be returned until the following business day.  We are closed weekends and major holidays.  You do have access to a nurse 24-7, just call the main number to the clinic [phone] and do not press any options, hold on the line and a nurse will answer the phone.    For prescription refill requests, have your pharmacy contact our office and allow 72 hours.    Due to Covid, you will  need to wear a mask upon entering the hospital. If you do not have a mask, a mask will be given to you at the Main Entrance upon arrival. For doctor visits, patients may have 1 support person age 63 or older with them. For treatment visits, patients can not have anyone with them due to social distancing guidelines and our immunocompromised population.

## 2024-03-03 ENCOUNTER — Other Ambulatory Visit: Payer: Self-pay

## 2024-03-03 DIAGNOSIS — D509 Iron deficiency anemia, unspecified: Secondary | ICD-10-CM

## 2024-03-03 DIAGNOSIS — E538 Deficiency of other specified B group vitamins: Secondary | ICD-10-CM

## 2024-03-04 ENCOUNTER — Inpatient Hospital Stay: Attending: Oncology

## 2024-03-04 DIAGNOSIS — D6941 Evans syndrome: Secondary | ICD-10-CM | POA: Insufficient documentation

## 2024-03-04 DIAGNOSIS — R5383 Other fatigue: Secondary | ICD-10-CM | POA: Diagnosis not present

## 2024-03-04 DIAGNOSIS — D509 Iron deficiency anemia, unspecified: Secondary | ICD-10-CM | POA: Diagnosis not present

## 2024-03-04 DIAGNOSIS — E538 Deficiency of other specified B group vitamins: Secondary | ICD-10-CM | POA: Insufficient documentation

## 2024-03-04 DIAGNOSIS — D591 Autoimmune hemolytic anemia, unspecified: Secondary | ICD-10-CM | POA: Diagnosis not present

## 2024-03-04 LAB — CBC WITH DIFFERENTIAL/PLATELET
Abs Immature Granulocytes: 0.02 K/uL (ref 0.00–0.07)
Basophils Absolute: 0 K/uL (ref 0.0–0.1)
Basophils Relative: 1 %
Eosinophils Absolute: 0.1 K/uL (ref 0.0–0.5)
Eosinophils Relative: 1 %
HCT: 43 % (ref 39.0–52.0)
Hemoglobin: 15.1 g/dL (ref 13.0–17.0)
Immature Granulocytes: 0 %
Lymphocytes Relative: 40 %
Lymphs Abs: 2.7 K/uL (ref 0.7–4.0)
MCH: 29.3 pg (ref 26.0–34.0)
MCHC: 35.1 g/dL (ref 30.0–36.0)
MCV: 83.5 fL (ref 80.0–100.0)
Monocytes Absolute: 0.8 K/uL (ref 0.1–1.0)
Monocytes Relative: 11 %
Neutro Abs: 3.1 K/uL (ref 1.7–7.7)
Neutrophils Relative %: 47 %
Platelets: 248 K/uL (ref 150–400)
RBC: 5.15 MIL/uL (ref 4.22–5.81)
RDW: 12.4 % (ref 11.5–15.5)
WBC: 6.7 K/uL (ref 4.0–10.5)
nRBC: 0 % (ref 0.0–0.2)

## 2024-03-04 LAB — VITAMIN B12: Vitamin B-12: 308 pg/mL (ref 180–914)

## 2024-03-04 LAB — IRON AND TIBC
Iron: 54 ug/dL (ref 45–182)
Saturation Ratios: 14 % — ABNORMAL LOW (ref 17.9–39.5)
TIBC: 385 ug/dL (ref 250–450)
UIBC: 331 ug/dL

## 2024-03-04 LAB — LACTATE DEHYDROGENASE: LDH: 166 U/L (ref 105–235)

## 2024-03-04 LAB — FERRITIN: Ferritin: 483 ng/mL — ABNORMAL HIGH (ref 24–336)

## 2024-03-05 ENCOUNTER — Other Ambulatory Visit: Payer: Self-pay | Admitting: Oncology

## 2024-03-05 NOTE — Progress Notes (Signed)
 RE: iron  levels.  Patient called to discuss most recent lab draw.  Iron /TIBC/Ferritin/ %Sat    Component Value Date/Time   IRON  54 03/04/2024 1442   IRON  70 06/15/2021 1620   TIBC 385 03/04/2024 1442   TIBC 356 06/15/2021 1620   FERRITIN 483 (H) 03/04/2024 1442   FERRITIN 330 06/15/2021 1620   IRONPCTSAT 14 (L) 03/04/2024 1442   IRONPCTSAT 20 06/15/2021 1620    CBC    Component Value Date/Time   WBC 6.7 03/04/2024 1442   RBC 5.15 03/04/2024 1442   HGB 15.1 03/04/2024 1442   HGB WILL FOLLOW 08/07/2021 0903   HCT 43.0 03/04/2024 1442   HCT WILL FOLLOW 08/07/2021 0903   PLT 248 03/04/2024 1442   PLT WILL FOLLOW 08/07/2021 0903   MCV 83.5 03/04/2024 1442   MCV WILL FOLLOW 08/07/2021 0903   MCH 29.3 03/04/2024 1442   MCHC 35.1 03/04/2024 1442   RDW 12.4 03/04/2024 1442   RDW WILL FOLLOW 08/07/2021 0903   LYMPHSABS 2.7 03/04/2024 1442   LYMPHSABS WILL FOLLOW 08/07/2021 0903   MONOABS 0.8 03/04/2024 1442   EOSABS 0.1 03/04/2024 1442   EOSABS WILL FOLLOW 08/07/2021 0903   BASOSABS 0.0 03/04/2024 1442   BASOSABS WILL FOLLOW 08/07/2021 0903   Asked that hemoglobin is normal but iron  saturations are low.  He has received 400 mg IV Venofer  in the past.  He is symptomatic with fatigue and is interested in additional IV iron .  His B12 levels are also low.  He is not seeing anyone until the spring 2026.  Discussed B12 injections monthly and 2 doses of IV iron .  He was agreeable.  Return to clinic as scheduled.  [name], AGNP-C Department of Hematology/Oncology [facility]  Phone: [phone]  03/05/2024 10:17 AM

## 2024-03-09 ENCOUNTER — Other Ambulatory Visit: Payer: Self-pay | Admitting: *Deleted

## 2024-03-09 ENCOUNTER — Inpatient Hospital Stay

## 2024-03-09 VITALS — BP 139/77 | HR 66 | Temp 98.7°F | Resp 16

## 2024-03-09 DIAGNOSIS — E538 Deficiency of other specified B group vitamins: Secondary | ICD-10-CM

## 2024-03-09 DIAGNOSIS — D6941 Evans syndrome: Secondary | ICD-10-CM | POA: Diagnosis not present

## 2024-03-09 DIAGNOSIS — D509 Iron deficiency anemia, unspecified: Secondary | ICD-10-CM

## 2024-03-09 DIAGNOSIS — D508 Other iron deficiency anemias: Secondary | ICD-10-CM

## 2024-03-09 MED ORDER — FAMOTIDINE IN NACL 20-0.9 MG/50ML-% IV SOLN
20.0000 mg | Freq: Once | INTRAVENOUS | Status: AC
  Administered 2024-03-09: 20 mg via INTRAVENOUS
  Filled 2024-03-09: qty 50

## 2024-03-09 MED ORDER — METHYLPREDNISOLONE SODIUM SUCC 125 MG IJ SOLR
125.0000 mg | Freq: Once | INTRAMUSCULAR | Status: AC
  Administered 2024-03-09: 125 mg via INTRAVENOUS
  Filled 2024-03-09: qty 2

## 2024-03-09 MED ORDER — CETIRIZINE HCL 10 MG PO TABS: 10.0000 mg | ORAL_TABLET | Freq: Once | ORAL | Status: DC

## 2024-03-09 MED ORDER — CETIRIZINE HCL 10 MG/ML IV SOLN
10.0000 mg | Freq: Once | INTRAVENOUS | Status: AC
  Administered 2024-03-09: 10 mg via INTRAVENOUS
  Filled 2024-03-09: qty 1

## 2024-03-09 MED ORDER — SODIUM CHLORIDE 0.9 % IV SOLN: Freq: Once | INTRAVENOUS | Status: AC

## 2024-03-09 MED ORDER — ACETAMINOPHEN 325 MG PO TABS
650.0000 mg | ORAL_TABLET | Freq: Once | ORAL | Status: AC
  Administered 2024-03-09: 650 mg via ORAL
  Filled 2024-03-09: qty 2

## 2024-03-09 MED ORDER — CYANOCOBALAMIN 1000 MCG/ML IJ SOLN: 1000.0000 ug | Freq: Once | INTRAMUSCULAR | Status: DC

## 2024-03-09 MED ORDER — SODIUM CHLORIDE 0.9 % IV SOLN
400.0000 mg | Freq: Once | INTRAVENOUS | Status: AC
  Administered 2024-03-09: 400 mg via INTRAVENOUS
  Filled 2024-03-09: qty 20

## 2024-03-09 NOTE — Progress Notes (Signed)
 Labs placed per [name], NP.  Patient will take oral B-12 instead of the injections per his request.  We will recheck labs 6 weeks after his last iron  infusion. Appts made and patient aware.

## 2024-03-09 NOTE — Progress Notes (Signed)
 Patient declined Vitamin B12 injection today.  Prefers OTC medication.  Pharmacy made aware.    Patient tolerated iron  infusion with no complaints voiced.  Peripheral IV site clean and dry with good blood return noted before and after infusion.  Band aid applied.  VSS with discharge and left in satisfactory condition with no s/s of distress noted.

## 2024-03-09 NOTE — Patient Instructions (Signed)

## 2024-03-16 ENCOUNTER — Inpatient Hospital Stay: Attending: Oncology

## 2024-03-16 VITALS — BP 146/89 | HR 91 | Temp 96.9°F | Resp 18

## 2024-03-16 DIAGNOSIS — D508 Other iron deficiency anemias: Secondary | ICD-10-CM

## 2024-03-16 DIAGNOSIS — D591 Autoimmune hemolytic anemia, unspecified: Secondary | ICD-10-CM | POA: Insufficient documentation

## 2024-03-16 DIAGNOSIS — E538 Deficiency of other specified B group vitamins: Secondary | ICD-10-CM | POA: Insufficient documentation

## 2024-03-16 DIAGNOSIS — D509 Iron deficiency anemia, unspecified: Secondary | ICD-10-CM | POA: Diagnosis not present

## 2024-03-16 DIAGNOSIS — D6941 Evans syndrome: Secondary | ICD-10-CM | POA: Insufficient documentation

## 2024-03-16 DIAGNOSIS — R5383 Other fatigue: Secondary | ICD-10-CM | POA: Insufficient documentation

## 2024-03-16 MED ORDER — METHYLPREDNISOLONE SODIUM SUCC 125 MG IJ SOLR
125.0000 mg | Freq: Once | INTRAMUSCULAR | Status: AC
  Administered 2024-03-16: 125 mg via INTRAVENOUS
  Filled 2024-03-16: qty 2

## 2024-03-16 MED ORDER — FAMOTIDINE IN NACL 20-0.9 MG/50ML-% IV SOLN
20.0000 mg | Freq: Once | INTRAVENOUS | Status: AC
  Administered 2024-03-16: 20 mg via INTRAVENOUS
  Filled 2024-03-16: qty 50

## 2024-03-16 MED ORDER — SODIUM CHLORIDE 0.9 % IV SOLN: INTRAVENOUS | Status: DC

## 2024-03-16 MED ORDER — CETIRIZINE HCL 10 MG/ML IV SOLN
10.0000 mg | Freq: Once | INTRAVENOUS | Status: AC
  Administered 2024-03-16: 10 mg via INTRAVENOUS
  Filled 2024-03-16: qty 1

## 2024-03-16 MED ORDER — SODIUM CHLORIDE 0.9 % IV SOLN
400.0000 mg | Freq: Once | INTRAVENOUS | Status: AC
  Administered 2024-03-16: 400 mg via INTRAVENOUS
  Filled 2024-03-16: qty 20

## 2024-03-16 MED ORDER — ACETAMINOPHEN 325 MG PO TABS
650.0000 mg | ORAL_TABLET | Freq: Once | ORAL | Status: AC
  Administered 2024-03-16: 650 mg via ORAL
  Filled 2024-03-16: qty 2

## 2024-03-16 NOTE — Progress Notes (Signed)
 Patient tolerated iron infusion with no complaints voiced.  Peripheral IV site clean and dry with good blood return noted before and after infusion.  Band aid applied.  VSS with discharge and left in satisfactory condition with no s/s of distress noted.

## 2024-03-16 NOTE — Patient Instructions (Signed)

## 2024-04-07 ENCOUNTER — Inpatient Hospital Stay

## 2024-04-12 ENCOUNTER — Encounter: Payer: Self-pay | Admitting: *Deleted

## 2024-05-03 ENCOUNTER — Inpatient Hospital Stay: Attending: Oncology

## 2024-05-03 DIAGNOSIS — D591 Autoimmune hemolytic anemia, unspecified: Secondary | ICD-10-CM | POA: Insufficient documentation

## 2024-05-03 DIAGNOSIS — R5383 Other fatigue: Secondary | ICD-10-CM | POA: Diagnosis not present

## 2024-05-03 DIAGNOSIS — D6941 Evans syndrome: Secondary | ICD-10-CM | POA: Insufficient documentation

## 2024-05-03 DIAGNOSIS — D509 Iron deficiency anemia, unspecified: Secondary | ICD-10-CM | POA: Diagnosis not present

## 2024-05-03 DIAGNOSIS — E538 Deficiency of other specified B group vitamins: Secondary | ICD-10-CM | POA: Insufficient documentation

## 2024-05-03 LAB — FERRITIN: Ferritin: 929 ng/mL — ABNORMAL HIGH (ref 24–336)

## 2024-05-03 LAB — CBC WITH DIFFERENTIAL/PLATELET
Abs Immature Granulocytes: 0.03 K/uL (ref 0.00–0.07)
Basophils Absolute: 0 K/uL (ref 0.0–0.1)
Basophils Relative: 1 %
Eosinophils Absolute: 0.1 K/uL (ref 0.0–0.5)
Eosinophils Relative: 1 %
HCT: 46.3 % (ref 39.0–52.0)
Hemoglobin: 16.3 g/dL (ref 13.0–17.0)
Immature Granulocytes: 1 %
Lymphocytes Relative: 37 %
Lymphs Abs: 2.5 K/uL (ref 0.7–4.0)
MCH: 29.1 pg (ref 26.0–34.0)
MCHC: 35.2 g/dL (ref 30.0–36.0)
MCV: 82.7 fL (ref 80.0–100.0)
Monocytes Absolute: 0.7 K/uL (ref 0.1–1.0)
Monocytes Relative: 11 %
Neutro Abs: 3.3 K/uL (ref 1.7–7.7)
Neutrophils Relative %: 49 %
Platelets: 249 K/uL (ref 150–400)
RBC: 5.6 MIL/uL (ref 4.22–5.81)
RDW: 13.3 % (ref 11.5–15.5)
WBC: 6.6 K/uL (ref 4.0–10.5)
nRBC: 0 % (ref 0.0–0.2)

## 2024-05-03 LAB — VITAMIN B12: Vitamin B-12: 829 pg/mL (ref 180–914)

## 2024-05-03 LAB — IRON AND TIBC
Iron: 76 ug/dL (ref 45–182)
Saturation Ratios: 22 % (ref 17.9–39.5)
TIBC: 356 ug/dL (ref 250–450)
UIBC: 279 ug/dL

## 2024-05-07 ENCOUNTER — Inpatient Hospital Stay

## 2024-06-07 ENCOUNTER — Inpatient Hospital Stay

## 2024-09-01 ENCOUNTER — Inpatient Hospital Stay

## 2024-09-08 ENCOUNTER — Inpatient Hospital Stay: Admitting: Oncology
